# Patient Record
Sex: Male | Born: 1935 | Race: Black or African American | Hispanic: No | Smoking: Never smoker
Health system: Southern US, Community
[De-identification: ages and names within clinical notes are randomized; demographics above are authoritative.]

## PROBLEM LIST (undated history)

## (undated) DIAGNOSIS — I10 Essential (primary) hypertension: Secondary | ICD-10-CM

## (undated) DIAGNOSIS — D649 Anemia, unspecified: Secondary | ICD-10-CM

## (undated) DIAGNOSIS — N289 Disorder of kidney and ureter, unspecified: Secondary | ICD-10-CM

## (undated) DIAGNOSIS — E162 Hypoglycemia, unspecified: Secondary | ICD-10-CM

## (undated) DIAGNOSIS — E119 Type 2 diabetes mellitus without complications: Secondary | ICD-10-CM

## (undated) DIAGNOSIS — N400 Enlarged prostate without lower urinary tract symptoms: Secondary | ICD-10-CM

## (undated) DIAGNOSIS — R339 Retention of urine, unspecified: Secondary | ICD-10-CM

## (undated) DIAGNOSIS — R609 Edema, unspecified: Secondary | ICD-10-CM

## (undated) HISTORY — PX: OTHER SURGICAL HISTORY: SHX169

---

## 2003-03-05 ENCOUNTER — Other Ambulatory Visit: Payer: Self-pay

## 2004-02-21 ENCOUNTER — Emergency Department: Payer: Self-pay | Admitting: Emergency Medicine

## 2004-02-24 ENCOUNTER — Ambulatory Visit: Payer: Self-pay | Admitting: Internal Medicine

## 2004-03-08 ENCOUNTER — Ambulatory Visit: Payer: Self-pay | Admitting: Urology

## 2004-03-08 ENCOUNTER — Other Ambulatory Visit: Payer: Self-pay

## 2004-03-10 ENCOUNTER — Ambulatory Visit: Payer: Self-pay | Admitting: Internal Medicine

## 2004-03-14 ENCOUNTER — Ambulatory Visit: Payer: Self-pay | Admitting: Urology

## 2005-03-13 ENCOUNTER — Inpatient Hospital Stay: Payer: Self-pay | Admitting: Internal Medicine

## 2005-04-10 ENCOUNTER — Other Ambulatory Visit: Payer: Self-pay

## 2005-04-10 ENCOUNTER — Ambulatory Visit: Payer: Self-pay | Admitting: Urology

## 2005-08-31 ENCOUNTER — Ambulatory Visit: Payer: Self-pay | Admitting: Internal Medicine

## 2005-09-05 ENCOUNTER — Ambulatory Visit: Payer: Self-pay | Admitting: Internal Medicine

## 2005-09-15 ENCOUNTER — Ambulatory Visit: Payer: Self-pay | Admitting: Surgery

## 2005-09-20 ENCOUNTER — Ambulatory Visit: Payer: Self-pay | Admitting: Surgery

## 2007-03-01 ENCOUNTER — Emergency Department: Payer: Self-pay | Admitting: Unknown Physician Specialty

## 2007-04-01 ENCOUNTER — Ambulatory Visit: Payer: Self-pay | Admitting: Family Medicine

## 2007-06-30 ENCOUNTER — Other Ambulatory Visit: Payer: Self-pay

## 2007-07-01 ENCOUNTER — Inpatient Hospital Stay: Payer: Self-pay | Admitting: Internal Medicine

## 2007-07-03 ENCOUNTER — Other Ambulatory Visit: Payer: Self-pay

## 2008-11-12 ENCOUNTER — Inpatient Hospital Stay: Payer: Self-pay | Admitting: Orthopedic Surgery

## 2009-03-03 ENCOUNTER — Emergency Department: Payer: Self-pay | Admitting: Emergency Medicine

## 2009-08-18 ENCOUNTER — Ambulatory Visit: Payer: Self-pay | Admitting: Gastroenterology

## 2009-12-08 ENCOUNTER — Inpatient Hospital Stay: Payer: Self-pay | Admitting: Internal Medicine

## 2010-05-25 ENCOUNTER — Inpatient Hospital Stay: Payer: Self-pay | Admitting: Surgery

## 2010-10-23 ENCOUNTER — Emergency Department: Payer: Self-pay | Admitting: Unknown Physician Specialty

## 2012-08-13 ENCOUNTER — Ambulatory Visit: Payer: Self-pay | Admitting: Urology

## 2012-08-19 ENCOUNTER — Ambulatory Visit: Payer: Self-pay | Admitting: Urology

## 2014-04-24 NOTE — Op Note (Signed)
PATIENT NAME:  Matthew Goodwin, Steve MR#:  [id number] DATE OF BIRTH:  Apr 28, 1920  DATE OF PROCEDURE:  08/19/2012  PREOPERATIVE DIAGNOSES:  1.  Urethral stricture disease.  2.  Urinary incontinence.   POSTOPERATIVE DIAGNOSES: 1.  Urethral stricture disease.  2.  Urinary incontinence.   PROCEDURE: Visual internal urethrotomy using the holmium laser.   SURGEON: Suszanne ConnersMichael R. Evelene CroonWolff, M.D.   ANESTHETIST: Dr. Noralyn Pickarroll.   ANESTHESIA METHOD: General and local per surgeon.   INDICATIONS: See the dictated history and physical. After informed consent, the patient requests the above procedure.   OPERATIVE SUMMARY: After adequate general anesthesia had been obtained, perineum was prepped and draped in the usual fashion. The 21-French cystoscope was placed into the urethra and coupled to the camera. The patient had extensive urethral stricture disease extending from approximately 2 cm proximal to the meatus all the way to the bladder neck. In fact, the cystoscope could not be advanced beyond 2 cm into the urethra. A 0.035 guidewire was then placed. The guidewire was curled into the bladder. The visual urethrotome was then used to incise the strictures at the 12 o'clock position and eventually the bladder was entered. The scope was then backed out and stricture was further incised with the holmium laser fiber. At the conclusion of internal urethrotomy, the urethra was approximately 20-French in caliber. The bladder was heavily trabeculated. Both ureteral orifices were identified and had clear efflux. No bladder tumors were identified. The patient also had a posterior urethral diverticulum located at the bulbar urethra. At this point, the scope was removed taking care to leave the guidewire in position. An 18-French council tipped catheter was advanced over the guidewire and positioned in the bladder. The guidewire was removed. The catheter was irrigated until clear. 10 mL of viscous Xylocaine was injected into the urethra  around the catheter. A B and O suppository was placed. The procedure was then terminated and the patient was transferred to the recovery room in stable condition.   ____________________________ Suszanne ConnersMichael R. Evelene CroonWolff, MD mrw:aw D: 08/19/2012 11:39:24 ET T: 08/19/2012 11:57:00 ET JOB#: [id number]  cc: Suszanne ConnersMichael R. Evelene CroonWolff, MD, <Dictator> Orson ApeMICHAEL R WOLFF MD ELECTRONICALLY SIGNED 08/20/2012 8:26
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
## 2015-05-25 ENCOUNTER — Other Ambulatory Visit: Payer: Self-pay | Admitting: Internal Medicine

## 2015-05-25 DIAGNOSIS — R601 Generalized edema: Secondary | ICD-10-CM

## 2015-05-27 ENCOUNTER — Ambulatory Visit
Admission: RE | Admit: 2015-05-27 | Discharge: 2015-05-27 | Disposition: A | Discharge status: Home / Self Care | Payer: Medicare Other | Source: Ambulatory Visit | Attending: Internal Medicine | Admitting: Internal Medicine

## 2015-05-27 DIAGNOSIS — I340 Nonrheumatic mitral (valve) insufficiency: Secondary | ICD-10-CM | POA: Insufficient documentation

## 2015-05-27 DIAGNOSIS — R601 Generalized edema: Secondary | ICD-10-CM | POA: Diagnosis not present

## 2015-05-27 DIAGNOSIS — I350 Nonrheumatic aortic (valve) stenosis: Secondary | ICD-10-CM | POA: Insufficient documentation

## 2015-05-27 NOTE — Progress Notes (Signed)
*  PRELIMINARY RESULTS* Echocardiogram 2D Echocardiogram has been performed.  Matthew Goodwin 05/27/2015, 11:20 AM

## 2015-07-02 ENCOUNTER — Other Ambulatory Visit: Payer: Self-pay | Admitting: Internal Medicine

## 2015-07-02 DIAGNOSIS — R7989 Other specified abnormal findings of blood chemistry: Secondary | ICD-10-CM

## 2015-07-02 DIAGNOSIS — N289 Disorder of kidney and ureter, unspecified: Secondary | ICD-10-CM

## 2015-07-21 ENCOUNTER — Ambulatory Visit
Admission: RE | Admit: 2015-07-21 | Discharge: 2015-07-21 | Disposition: A | Discharge status: Home / Self Care | Payer: Medicare Other | Source: Ambulatory Visit | Attending: Internal Medicine | Admitting: Internal Medicine

## 2015-07-21 DIAGNOSIS — R748 Abnormal levels of other serum enzymes: Secondary | ICD-10-CM | POA: Diagnosis not present

## 2015-07-21 DIAGNOSIS — N289 Disorder of kidney and ureter, unspecified: Secondary | ICD-10-CM | POA: Diagnosis not present

## 2015-07-21 DIAGNOSIS — R7989 Other specified abnormal findings of blood chemistry: Secondary | ICD-10-CM

## 2015-07-21 DIAGNOSIS — N261 Atrophy of kidney (terminal): Secondary | ICD-10-CM | POA: Insufficient documentation

## 2016-12-22 ENCOUNTER — Emergency Department
Admission: EM | Admit: 2016-12-22 | Discharge: 2016-12-22 | Disposition: A | Discharge status: Home / Self Care | Payer: Medicare Other | Attending: Emergency Medicine | Admitting: Emergency Medicine

## 2016-12-22 ENCOUNTER — Emergency Department: Payer: Medicare Other

## 2016-12-22 ENCOUNTER — Encounter: Payer: Self-pay | Admitting: Emergency Medicine

## 2016-12-22 DIAGNOSIS — Y929 Unspecified place or not applicable: Secondary | ICD-10-CM | POA: Insufficient documentation

## 2016-12-22 DIAGNOSIS — Y939 Activity, unspecified: Secondary | ICD-10-CM | POA: Insufficient documentation

## 2016-12-22 DIAGNOSIS — W010XXA Fall on same level from slipping, tripping and stumbling without subsequent striking against object, initial encounter: Secondary | ICD-10-CM | POA: Diagnosis not present

## 2016-12-22 DIAGNOSIS — W19XXXA Unspecified fall, initial encounter: Secondary | ICD-10-CM

## 2016-12-22 DIAGNOSIS — T148XXA Other injury of unspecified body region, initial encounter: Secondary | ICD-10-CM

## 2016-12-22 DIAGNOSIS — Y999 Unspecified external cause status: Secondary | ICD-10-CM | POA: Diagnosis not present

## 2016-12-22 DIAGNOSIS — I10 Essential (primary) hypertension: Secondary | ICD-10-CM | POA: Insufficient documentation

## 2016-12-22 DIAGNOSIS — E119 Type 2 diabetes mellitus without complications: Secondary | ICD-10-CM | POA: Diagnosis not present

## 2016-12-22 DIAGNOSIS — S0081XA Abrasion of other part of head, initial encounter: Secondary | ICD-10-CM | POA: Diagnosis not present

## 2016-12-22 DIAGNOSIS — S0990XA Unspecified injury of head, initial encounter: Secondary | ICD-10-CM | POA: Diagnosis present
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
## 2016-12-22 NOTE — ED Notes (Signed)
Attempted to call contact number in chart. Spoke with Matthew NorrisFrances Watlington, who is the emergency contact in his chart. Ms. Jac CanavanWatlington states that pt lives in a group home but she is unsure of the name of the group home and she is unable to come pick patient up.   Pt does not know the name of the group home that he is from.

## 2016-12-22 NOTE — ED Notes (Signed)
Phone number and address in the chart is for group home that pt no longer lives at. [employer] attempted to pull address for patient. Current address is not correct in their system either.  Per patient cousin, pt lives at a group home on [address] but she is unsure of the exact address or the phone number for the patient.

## 2016-12-22 NOTE — ED Notes (Signed)
Pt's cousin came to hospital to see patient. Pt cousin provided number for [facility], which is where she states that pt lives now. This RN spoke with family care home and informed them that pt was here in our ED and that he was ready to be discharged. Pt cousin, Shirlee LimerickMarion, is agreeable to taking pt home.

## 2018-02-13 ENCOUNTER — Emergency Department
Admission: EM | Admit: 2018-02-13 | Discharge: 2018-02-13 | Disposition: A | Discharge status: Home / Self Care | Payer: Medicare Other | Attending: Emergency Medicine | Admitting: Emergency Medicine

## 2018-02-13 ENCOUNTER — Other Ambulatory Visit: Payer: Self-pay

## 2018-02-13 ENCOUNTER — Emergency Department: Payer: Medicare Other

## 2018-02-13 DIAGNOSIS — Z79899 Other long term (current) drug therapy: Secondary | ICD-10-CM | POA: Insufficient documentation

## 2018-02-13 DIAGNOSIS — N3000 Acute cystitis without hematuria: Secondary | ICD-10-CM | POA: Diagnosis not present

## 2018-02-13 DIAGNOSIS — R531 Weakness: Secondary | ICD-10-CM

## 2018-02-13 HISTORY — DX: Hypoglycemia, unspecified: E16.2

## 2018-02-13 HISTORY — DX: Benign prostatic hyperplasia without lower urinary tract symptoms: N40.0

## 2018-02-13 HISTORY — DX: Essential (primary) hypertension: I10

## 2018-02-13 HISTORY — DX: Anemia, unspecified: D64.9

## 2018-02-13 HISTORY — DX: Retention of urine, unspecified: R33.9

## 2018-02-13 HISTORY — DX: Type 2 diabetes mellitus without complications: E11.9

## 2018-02-13 HISTORY — DX: Edema, unspecified: R60.9

## 2018-02-13 LAB — URINALYSIS, COMPLETE (UACMP) WITH MICROSCOPIC
BILIRUBIN URINE: NEGATIVE
Glucose, UA: NEGATIVE mg/dL
Hgb urine dipstick: NEGATIVE
Ketones, ur: NEGATIVE mg/dL
Nitrite: POSITIVE — AB
PH: 6 (ref 5.0–8.0)
Protein, ur: NEGATIVE mg/dL
Specific Gravity, Urine: 1.014 (ref 1.005–1.030)

## 2018-02-13 LAB — CBC
HEMATOCRIT: 40.1 % (ref 39.0–52.0)
HEMOGLOBIN: 12.9 g/dL — AB (ref 13.0–17.0)
MCH: 30.2 pg (ref 26.0–34.0)
MCHC: 32.2 g/dL (ref 30.0–36.0)
MCV: 93.9 fL (ref 80.0–100.0)
Platelets: 158 10*3/uL (ref 150–400)
RBC: 4.27 MIL/uL (ref 4.22–5.81)
RDW: 12.8 % (ref 11.5–15.5)
WBC: 3.4 10*3/uL — ABNORMAL LOW (ref 4.0–10.5)
nRBC: 0 % (ref 0.0–0.2)

## 2018-02-13 LAB — COMPREHENSIVE METABOLIC PANEL
ALK PHOS: 52 U/L (ref 38–126)
ALT: 18 U/L (ref 0–44)
ANION GAP: 6 (ref 5–15)
AST: 31 U/L (ref 15–41)
Albumin: 3.8 g/dL (ref 3.5–5.0)
BILIRUBIN TOTAL: 1.1 mg/dL (ref 0.3–1.2)
BUN: 15 mg/dL (ref 8–23)
CALCIUM: 8.9 mg/dL (ref 8.9–10.3)
CO2: 25 mmol/L (ref 22–32)
Chloride: 106 mmol/L (ref 98–111)
Creatinine, Ser: 1.32 mg/dL — ABNORMAL HIGH (ref 0.61–1.24)
GFR calc Af Amer: 53 mL/min — ABNORMAL LOW (ref >60)
GFR, EST NON AFRICAN AMERICAN: 46 mL/min — AB (ref >60)
Glucose, Bld: 131 mg/dL — ABNORMAL HIGH (ref 70–99)
POTASSIUM: 4.3 mmol/L (ref 3.5–5.1)
Sodium: 137 mmol/L (ref 135–145)
TOTAL PROTEIN: 7 g/dL (ref 6.5–8.1)

## 2018-02-13 LAB — TROPONIN I
TROPONIN I: 0.05 ng/mL — AB (ref <0.03)
TROPONIN I: 0.05 ng/mL — AB (ref <0.03)

## 2018-02-13 MED ORDER — SULFAMETHOXAZOLE-TRIMETHOPRIM 800-160 MG PO TABS: 1.0000 | ORAL_TABLET | Freq: Two times a day (BID) | ORAL | 2 refills | Status: DC

## 2018-02-13 MED ORDER — SODIUM CHLORIDE 0.9 % IV SOLN
1.0000 g | Freq: Once | INTRAVENOUS | Status: AC
  Administered 2018-02-13: 1 g via INTRAVENOUS
  Filled 2018-02-13: qty 10

## 2018-02-13 MED ORDER — SULFAMETHOXAZOLE-TRIMETHOPRIM 800-160 MG PO TABS: 1.0000 | ORAL_TABLET | Freq: Two times a day (BID) | ORAL | 0 refills | Status: AC

## 2018-02-13 NOTE — ED Provider Notes (Signed)
Patient was signed out to me by Dr. Fritz Pickerel house key.  85 year old gentleman who lives in a nursing facility who presented for generalized weakness without any focal symptoms.  He did have a troponin of 0.05, which has been repeated and unchanged.  I did confirm with the patient that he has not been having any chest pain.  During his ED stay, the patient has remained hemodynamically stable.  He states that he is feeling better now than upon arrival.  His UA is consistent with a UTI.  A urine culture has been sent and the patient will receive IV Rocephin here.  I have asked the patient if he prefers to return home, or stay at the hospital, and he states that he is feeling better and would like to go home with oral antibiotics.  I have written instructions for close PMD follow-up.  We discussed return precautions.  At this time, the patient is safe for discharge home.   Rockne Menghini, MD 02/13/18 Windell Moment

## 2018-02-13 NOTE — Discharge Instructions (Addendum)
Please drink plenty of fluid to stay well-hydrated.  Take the entire course of antibiotics, even if you are feeling better.  Return to the emergency department if you develop severe pain, lightheadedness or fainting, chest pain, shortness of breath, fever, or any other symptoms concerning to you.

## 2018-02-13 NOTE — ED Triage Notes (Signed)
Patient from [facility] with c/o of weakness to b/l lower extremities. Patient reports s/p fall yesterday c/o of headache since fall. Reports hit the back of his hes when he fell.

## 2018-02-13 NOTE — ED Provider Notes (Signed)
[facility] Emergency Department Provider Note  Time seen: 2:17 PM  I have reviewed the triage vital signs and the nursing notes.   HISTORY  Chief Complaint Weakness    HPI Matthew Goodwin is a [age] y.o. male with a past medical history of hypertension, BPH, presents the emergency department for generalized weakness.  According to EMS report patient was very weak today unable to stand without significant assistance so they sent him to the emergency department from his nursing facility for evaluation.  Currently the patient is awake and alert, he has no complaints besides a mild headache.  He states he fell yesterday hitting his head.  Patient denies any other symptoms.    No past medical history on file.  There are no active problems to display for this patient.    Prior to Admission medications   Medication Sig Start Date End Date Taking? Authorizing Provider  hydrochlorothiazide (HYDRODIURIL) 25 MG tablet Take 1 tablet by mouth daily. 12/11/16   [provider]  levocetirizine (XYZAL) 5 MG tablet Take 1 tablet by mouth daily. 12/11/16   [provider]  losartan (COZAAR) 50 MG tablet Take 1 tablet by mouth daily. 12/11/16   [provider]  metoprolol succinate (TOPROL-XL) 50 MG 24 hr tablet Take 1 tablet by mouth daily. 12/11/16   [provider]  tamsulosin (FLOMAX) 0.4 MG CAPS capsule Take 1 capsule by mouth daily. 12/11/16   [provider]    Not on File  No family history on file.  Social History Social History   Tobacco Use  . Smoking status: Not on file  Substance Use Topics  . Alcohol use: Not on file  . Drug use: Not on file    Review of Systems Constitutional: Negative for fever.  Generalized weakness Cardiovascular: Negative for chest pain. Respiratory: Negative for shortness of breath. Gastrointestinal: Negative for abdominal pain, vomiting Musculoskeletal: Negative for musculoskeletal  complaints Neurological: Negative for headache All other ROS negative  ____________________________________________   PHYSICAL EXAM:  Constitutional: Patient is awake and alert, no distress, overall very well-appearing. Eyes: Normal exam ENT   Head: Normocephalic and atraumatic, without abrasion or hematomas.   Mouth/Throat: Mucous membranes are moist. Cardiovascular: Normal rate, regular rhythm. Respiratory: Normal respiratory effort without tachypnea nor retractions. Breath sounds are clear  Gastrointestinal: Soft and nontender. No distention. Musculoskeletal: Nontender with normal range of motion in all extremities.  Neurologic:  Normal speech and language.  Patient has equal grip strength bilaterally 5/5 motor bilateral upper extremities.  Patient has 4+/5 motor in bilateral lower extremities.  No focal deficits identified on exam. Skin:  Skin is warm, dry and intact.  Psychiatric: Mood and affect are normal. Speech and behavior are normal.   ____________________________________________    EKG  EKG viewed and interpreted by myself shows appears to be a paced rhythm at 91 bpm, narrow QRS, normal axis, prolonged QTC otherwise normal intervals, nonspecific ST changes.  ____________________________________________    RADIOLOGY  CT scan of the head is negative, no acute abnormality.  ____________________________________________   INITIAL IMPRESSION / ASSESSMENT AND PLAN / ED COURSE  Pertinent labs & imaging results that were available during my care of the patient were reviewed by me and considered in my medical decision making (see chart for details).  Patient presents to the emergency department for generalized weakness for the past 2 days possible fall yesterday.  Overall the patient appears very well, normal exam.  Patient does have mild weakness of bilateral  lower extremities, appears to be equal bilaterally without focal deficit identified.  Given the potential  for possible fall yesterday we will obtain CT imaging of the head.  We will check labs including cardiac enzymes and a urinalysis.  Overall patient appears well and in no distress.  CT scan of the head is negative, no acute abnormality.  Troponin 0.05 however no old troponins for comparison.  No chest pain or shortness of breath.  We will repeat a troponin to ensure this is stable for the patient.  Although the patient is more weak than normal and the weakness is bilateral do not suspect CVA.  CT head negative.  Patient lives at a skilled nursing facility I do believe the patient would be safe for discharge home if repeat troponin and urinalysis are normal/unchanged.  Patient care signed out to Dr. Sharma CovertNorman  ____________________________________________   FINAL CLINICAL IMPRESSION(S) / ED DIAGNOSES  Weakness   Matthew Goodwin, Hooper Petteway, MD 02/13/18 1524

## 2018-02-13 NOTE — ED Notes (Signed)
Repeat trop drawn and sent

## 2018-02-13 NOTE — ED Notes (Signed)
Awaiting ct head. Labs sent.

## 2018-02-16 LAB — URINE CULTURE

## 2019-05-06 ENCOUNTER — Emergency Department: Payer: Medicare Other

## 2019-05-06 ENCOUNTER — Other Ambulatory Visit: Payer: Self-pay

## 2019-05-06 ENCOUNTER — Inpatient Hospital Stay
Admission: EM | Admit: 2019-05-06 | Discharge: 2019-05-08 | DRG: 543 | Disposition: A | Discharge status: Home Health | Payer: Medicare Other | Attending: Internal Medicine | Admitting: Internal Medicine

## 2019-05-06 DIAGNOSIS — N1330 Unspecified hydronephrosis: Secondary | ICD-10-CM

## 2019-05-06 DIAGNOSIS — S32020A Wedge compression fracture of second lumbar vertebra, initial encounter for closed fracture: Secondary | ICD-10-CM | POA: Diagnosis present

## 2019-05-06 DIAGNOSIS — Z23 Encounter for immunization: Secondary | ICD-10-CM

## 2019-05-06 DIAGNOSIS — S32010A Wedge compression fracture of first lumbar vertebra, initial encounter for closed fracture: Secondary | ICD-10-CM

## 2019-05-06 DIAGNOSIS — E119 Type 2 diabetes mellitus without complications: Secondary | ICD-10-CM

## 2019-05-06 DIAGNOSIS — D696 Thrombocytopenia, unspecified: Secondary | ICD-10-CM | POA: Diagnosis present

## 2019-05-06 DIAGNOSIS — N179 Acute kidney failure, unspecified: Secondary | ICD-10-CM | POA: Diagnosis present

## 2019-05-06 DIAGNOSIS — F0390 Unspecified dementia without behavioral disturbance: Secondary | ICD-10-CM | POA: Diagnosis present

## 2019-05-06 DIAGNOSIS — I129 Hypertensive chronic kidney disease with stage 1 through stage 4 chronic kidney disease, or unspecified chronic kidney disease: Secondary | ICD-10-CM | POA: Diagnosis present

## 2019-05-06 DIAGNOSIS — Y92009 Unspecified place in unspecified non-institutional (private) residence as the place of occurrence of the external cause: Secondary | ICD-10-CM

## 2019-05-06 DIAGNOSIS — W19XXXA Unspecified fall, initial encounter: Secondary | ICD-10-CM | POA: Diagnosis present

## 2019-05-06 DIAGNOSIS — N390 Urinary tract infection, site not specified: Secondary | ICD-10-CM

## 2019-05-06 DIAGNOSIS — S32019A Unspecified fracture of first lumbar vertebra, initial encounter for closed fracture: Secondary | ICD-10-CM

## 2019-05-06 DIAGNOSIS — I10 Essential (primary) hypertension: Secondary | ICD-10-CM

## 2019-05-06 DIAGNOSIS — M545 Low back pain, unspecified: Secondary | ICD-10-CM

## 2019-05-06 DIAGNOSIS — M549 Dorsalgia, unspecified: Secondary | ICD-10-CM | POA: Diagnosis not present

## 2019-05-06 DIAGNOSIS — I700 Atherosclerosis of aorta: Secondary | ICD-10-CM | POA: Diagnosis present

## 2019-05-06 DIAGNOSIS — N289 Disorder of kidney and ureter, unspecified: Secondary | ICD-10-CM | POA: Diagnosis present

## 2019-05-06 DIAGNOSIS — M419 Scoliosis, unspecified: Secondary | ICD-10-CM | POA: Diagnosis present

## 2019-05-06 DIAGNOSIS — N3000 Acute cystitis without hematuria: Secondary | ICD-10-CM

## 2019-05-06 DIAGNOSIS — N1831 Chronic kidney disease, stage 3a: Secondary | ICD-10-CM | POA: Diagnosis present

## 2019-05-06 DIAGNOSIS — Z79899 Other long term (current) drug therapy: Secondary | ICD-10-CM

## 2019-05-06 DIAGNOSIS — Z20822 Contact with and (suspected) exposure to covid-19: Secondary | ICD-10-CM | POA: Diagnosis present

## 2019-05-06 DIAGNOSIS — E1121 Type 2 diabetes mellitus with diabetic nephropathy: Secondary | ICD-10-CM

## 2019-05-06 DIAGNOSIS — M48061 Spinal stenosis, lumbar region without neurogenic claudication: Secondary | ICD-10-CM | POA: Diagnosis present

## 2019-05-06 DIAGNOSIS — M4856XA Collapsed vertebra, not elsewhere classified, lumbar region, initial encounter for fracture: Secondary | ICD-10-CM | POA: Diagnosis not present

## 2019-05-06 DIAGNOSIS — N400 Enlarged prostate without lower urinary tract symptoms: Secondary | ICD-10-CM | POA: Diagnosis present

## 2019-05-06 DIAGNOSIS — H9190 Unspecified hearing loss, unspecified ear: Secondary | ICD-10-CM | POA: Diagnosis present

## 2019-05-06 DIAGNOSIS — N136 Pyonephrosis: Secondary | ICD-10-CM | POA: Diagnosis present

## 2019-05-06 DIAGNOSIS — E1122 Type 2 diabetes mellitus with diabetic chronic kidney disease: Secondary | ICD-10-CM | POA: Diagnosis present

## 2019-05-06 DIAGNOSIS — I350 Nonrheumatic aortic (valve) stenosis: Secondary | ICD-10-CM

## 2019-05-06 HISTORY — DX: Disorder of kidney and ureter, unspecified: N28.9

## 2019-05-06 HISTORY — DX: Unspecified fall, initial encounter: Y92.009

## 2019-05-06 HISTORY — DX: Unspecified hydronephrosis: N13.30

## 2019-05-06 LAB — CBC WITH DIFFERENTIAL/PLATELET
Abs Immature Granulocytes: 0.02 10*3/uL (ref 0.00–0.07)
Basophils Absolute: 0 10*3/uL (ref 0.0–0.1)
Basophils Relative: 0 %
Eosinophils Absolute: 0 10*3/uL (ref 0.0–0.5)
Eosinophils Relative: 1 %
HCT: 39.6 % (ref 39.0–52.0)
Hemoglobin: 13.2 g/dL (ref 13.0–17.0)
Immature Granulocytes: 0 %
Lymphocytes Relative: 20 %
Lymphs Abs: 1.3 10*3/uL (ref 0.7–4.0)
MCH: 30.5 pg (ref 26.0–34.0)
MCHC: 33.3 g/dL (ref 30.0–36.0)
MCV: 91.5 fL (ref 80.0–100.0)
Monocytes Absolute: 0.5 10*3/uL (ref 0.1–1.0)
Monocytes Relative: 7 %
Neutro Abs: 4.5 10*3/uL (ref 1.7–7.7)
Neutrophils Relative %: 72 %
Platelets: 138 10*3/uL — ABNORMAL LOW (ref 150–400)
RBC: 4.33 MIL/uL (ref 4.22–5.81)
RDW: 12.6 % (ref 11.5–15.5)
WBC: 6.3 10*3/uL (ref 4.0–10.5)
nRBC: 0 % (ref 0.0–0.2)

## 2019-05-06 LAB — URINALYSIS, COMPLETE (UACMP) WITH MICROSCOPIC
Bilirubin Urine: NEGATIVE
Glucose, UA: NEGATIVE mg/dL
Hgb urine dipstick: NEGATIVE
Ketones, ur: NEGATIVE mg/dL
Nitrite: POSITIVE — AB
Protein, ur: NEGATIVE mg/dL
Specific Gravity, Urine: 1.01 (ref 1.005–1.030)
pH: 8 (ref 5.0–8.0)

## 2019-05-06 LAB — RESPIRATORY PANEL BY RT PCR (FLU A&B, COVID)
Influenza A by PCR: NEGATIVE
Influenza B by PCR: NEGATIVE
SARS Coronavirus 2 by RT PCR: NEGATIVE

## 2019-05-06 LAB — GLUCOSE, CAPILLARY
Glucose-Capillary: 80 mg/dL (ref 70–99)
Glucose-Capillary: 111 mg/dL — ABNORMAL HIGH (ref 70–99)

## 2019-05-06 LAB — BASIC METABOLIC PANEL
Anion gap: 6 (ref 5–15)
BUN: 18 mg/dL (ref 8–23)
CO2: 25 mmol/L (ref 22–32)
Calcium: 9.2 mg/dL (ref 8.9–10.3)
Chloride: 107 mmol/L (ref 98–111)
Creatinine, Ser: 1.29 mg/dL — ABNORMAL HIGH (ref 0.61–1.24)
GFR calc Af Amer: 54 mL/min — ABNORMAL LOW (ref >60)
GFR calc non Af Amer: 46 mL/min — ABNORMAL LOW (ref >60)
Glucose, Bld: 120 mg/dL — ABNORMAL HIGH (ref 70–99)
Potassium: 5.1 mmol/L (ref 3.5–5.1)
Sodium: 138 mmol/L (ref 135–145)

## 2019-05-06 LAB — HEMOGLOBIN A1C
Hgb A1c MFr Bld: 6 % — ABNORMAL HIGH (ref 4.8–5.6)
Mean Plasma Glucose: 125.5 mg/dL

## 2019-05-06 MED ORDER — SODIUM CHLORIDE 0.9% FLUSH
3.0000 mL | Freq: Two times a day (BID) | INTRAVENOUS | Status: DC
  Administered 2019-05-06 – 2019-05-07 (×2): 3 mL via INTRAVENOUS

## 2019-05-06 MED ORDER — LOSARTAN POTASSIUM 50 MG PO TABS
50.0000 mg | ORAL_TABLET | Freq: Every day | ORAL | Status: DC
  Administered 2019-05-07 – 2019-05-08 (×2): 50 mg via ORAL
  Filled 2019-05-06 (×2): qty 1

## 2019-05-06 MED ORDER — ONDANSETRON HCL 4 MG PO TABS: 4.0000 mg | ORAL_TABLET | Freq: Four times a day (QID) | ORAL | Status: DC | PRN

## 2019-05-06 MED ORDER — OXYCODONE HCL 5 MG PO TABS
5.0000 mg | ORAL_TABLET | ORAL | Status: DC | PRN
  Administered 2019-05-07 – 2019-05-08 (×3): 5 mg via ORAL
  Filled 2019-05-06 (×3): qty 1

## 2019-05-06 MED ORDER — ONDANSETRON HCL 4 MG/2ML IJ SOLN: 4.0000 mg | Freq: Four times a day (QID) | INTRAMUSCULAR | Status: DC | PRN

## 2019-05-06 MED ORDER — LIDOCAINE 5 % EX PTCH
1.0000 | MEDICATED_PATCH | CUTANEOUS | Status: DC
  Administered 2019-05-06 – 2019-05-08 (×3): 1 via TRANSDERMAL
  Filled 2019-05-06 (×3): qty 1

## 2019-05-06 MED ORDER — IOHEXOL 300 MG/ML  SOLN
100.0000 mL | Freq: Once | INTRAMUSCULAR | Status: AC | PRN
  Administered 2019-05-06: 12:00:00 100 mL via INTRAVENOUS

## 2019-05-06 MED ORDER — INSULIN ASPART 100 UNIT/ML ~~LOC~~ SOLN: 0.0000 [IU] | Freq: Three times a day (TID) | SUBCUTANEOUS | Status: DC

## 2019-05-06 MED ORDER — SODIUM CHLORIDE 0.9 % IV SOLN: 250.0000 mL | INTRAVENOUS | Status: DC | PRN

## 2019-05-06 MED ORDER — FLUOROMETHOLONE 0.1 % OP SUSP
1.0000 [drp] | Freq: Every day | OPHTHALMIC | Status: DC
  Administered 2019-05-07 – 2019-05-08 (×2): 1 [drp] via OPHTHALMIC
  Filled 2019-05-06: qty 5

## 2019-05-06 MED ORDER — ACETAMINOPHEN 500 MG PO TABS
1000.0000 mg | ORAL_TABLET | Freq: Once | ORAL | Status: AC
  Administered 2019-05-06: 10:00:00 1000 mg via ORAL
  Filled 2019-05-06: qty 2

## 2019-05-06 MED ORDER — SODIUM CHLORIDE 0.9 % IV SOLN
1.0000 g | Freq: Once | INTRAVENOUS | Status: AC
  Administered 2019-05-06: 15:00:00 1 g via INTRAVENOUS
  Filled 2019-05-06: qty 10

## 2019-05-06 MED ORDER — ATORVASTATIN CALCIUM 20 MG PO TABS
10.0000 mg | ORAL_TABLET | Freq: Every day | ORAL | Status: DC
  Administered 2019-05-06 – 2019-05-07 (×2): 10 mg via ORAL
  Filled 2019-05-06 (×2): qty 1

## 2019-05-06 MED ORDER — ACETAMINOPHEN 650 MG RE SUPP: 650.0000 mg | Freq: Four times a day (QID) | RECTAL | Status: DC | PRN

## 2019-05-06 MED ORDER — PNEUMOCOCCAL VAC POLYVALENT 25 MCG/0.5ML IJ INJ
0.5000 mL | INJECTION | INTRAMUSCULAR | Status: AC
  Administered 2019-05-07: 08:00:00 0.5 mL via INTRAMUSCULAR

## 2019-05-06 MED ORDER — TAMSULOSIN HCL 0.4 MG PO CAPS
0.4000 mg | ORAL_CAPSULE | Freq: Every day | ORAL | Status: DC
  Administered 2019-05-07 – 2019-05-08 (×2): 0.4 mg via ORAL
  Filled 2019-05-06 (×2): qty 1

## 2019-05-06 MED ORDER — CEPHALEXIN 500 MG PO CAPS
500.0000 mg | ORAL_CAPSULE | Freq: Two times a day (BID) | ORAL | Status: DC
  Administered 2019-05-06 – 2019-05-08 (×4): 500 mg via ORAL
  Filled 2019-05-06 (×4): qty 1

## 2019-05-06 MED ORDER — SODIUM CHLORIDE 0.9% FLUSH
3.0000 mL | Freq: Two times a day (BID) | INTRAVENOUS | Status: DC
  Administered 2019-05-06: 21:00:00 3 mL via INTRAVENOUS

## 2019-05-06 MED ORDER — HYDRALAZINE HCL 10 MG PO TABS
10.0000 mg | ORAL_TABLET | Freq: Three times a day (TID) | ORAL | Status: DC | PRN
  Administered 2019-05-06: 19:00:00 10 mg via ORAL
  Filled 2019-05-06 (×3): qty 1

## 2019-05-06 MED ORDER — SODIUM CHLORIDE 0.9% FLUSH: 3.0000 mL | INTRAVENOUS | Status: DC | PRN

## 2019-05-06 MED ORDER — LEVOCETIRIZINE DIHYDROCHLORIDE 5 MG PO TABS: 5.0000 mg | ORAL_TABLET | Freq: Every day | ORAL | Status: DC

## 2019-05-06 MED ORDER — MORPHINE SULFATE (PF) 2 MG/ML IV SOLN
2.0000 mg | INTRAVENOUS | Status: DC | PRN
  Administered 2019-05-07: 2 mg via INTRAVENOUS
  Filled 2019-05-06: qty 1

## 2019-05-06 MED ORDER — ACETAMINOPHEN 325 MG PO TABS: 650.0000 mg | ORAL_TABLET | Freq: Four times a day (QID) | ORAL | Status: DC | PRN

## 2019-05-06 MED ORDER — METOPROLOL SUCCINATE ER 50 MG PO TB24
100.0000 mg | ORAL_TABLET | Freq: Every day | ORAL | Status: DC
  Administered 2019-05-07 – 2019-05-08 (×2): 100 mg via ORAL
  Filled 2019-05-06 (×2): qty 2

## 2019-05-06 MED ORDER — LORATADINE 10 MG PO TABS
10.0000 mg | ORAL_TABLET | Freq: Every day | ORAL | Status: DC
  Administered 2019-05-07 – 2019-05-08 (×2): 10 mg via ORAL
  Filled 2019-05-06 (×2): qty 1

## 2019-05-06 NOTE — H&P (Signed)
History and Physical  Matthew Goodwin HQI:[id number] DOB: Mar 26, 1920 DOA: 05/06/2019  PCP: Sherrie Mustache, MD   Chief Complaint: back pain  HPI:  89yom PMH presented from [facility] after a fall resulting in back pain.  He was unable to sit up or ambulate in the emergency department was referred for observation.  Patient reports falling but is unable to provide any details.  He appears to possibly have some mild dementia, does comment on review of systems as documented below, but despite careful questioning, is unable to offer any further details in regard to recent events.  He denied back pain until he tried to sit up.  Answers a lot of questions "I do not know about that".  Discussed with Leanne Chang at [facility], she reports patient has been doing well, he does require some assistance with bathing but otherwise is pretty independent and frequently walking.  Fasting blood sugars are less than 100.  Yesterday he tried to sit down, missed the chair and landed with his buttocks on the concrete.  Did not strike head.  He was able to get up and walk thereafter but as the last 24 hours have progressed he has had increased pain and was therefore sent to the emergency department.  Discussed medical, surgical history which is documented below.  There is no power of attorney.  No further history is available.  Chart review: . No office visits or hospitalizations on file  ED Course: Treated with Tylenol, lidocaine patch, ceftriaxone  Review of Systems:  Patient denies fever, there is vision problems, sore throat, chest pain, shortness of breath, abdominal pain.  History is unreliable and cannot get a clear answer to some questions.  PMH . Diabetes mellitus type 2 . BPH . Essential hypertension . Renal insufficiency . Remainder reviewed in Epic  PSH . Unknown  Family history includes: . Patient does not know  Social History . Non-smoker, nondrinker  Allergies . None noted  Meds  include: No current facility-administered medications on file prior to encounter.   Current Outpatient Medications on File Prior to Encounter  Medication Sig Dispense Refill  . acetaminophen (TYLENOL) 325 MG tablet Take 650 mg by mouth every 6 (six) hours as needed for mild pain or moderate pain.    Marland Kitchen atorvastatin (LIPITOR) 10 MG tablet Take 10 mg by mouth at bedtime.    . docusate sodium (COLACE) 100 MG capsule Take 100 mg by mouth daily.    . ferrous sulfate 325 (65 FE) MG tablet Take 325 mg by mouth daily.    . fluorometholone (FML) 0.1 % ophthalmic suspension Place 1 drop into both eyes daily.    Marland Kitchen levocetirizine (XYZAL) 5 MG tablet Take 5 mg by mouth daily.     Marland Kitchen losartan (COZAAR) 50 MG tablet Take 50 mg by mouth daily.     . metoprolol succinate (TOPROL-XL) 100 MG 24 hr tablet Take 100 mg by mouth daily.     . Omega-3 Fatty Acids (FISH OIL) 1000 MG CAPS Take 1,000 mg by mouth 2 (two) times daily.    . tamsulosin (FLOMAX) 0.4 MG CAPS capsule Take 0.4 mg by mouth daily.     . Vitamin D, Ergocalciferol, (DRISDOL) 1.25 MG (50000 UNIT) CAPS capsule Take 50,000 Units by mouth every Wednesday.     Physicial Exam   Vitals:  . Afebrile, 98.4, 62, 142/72, 100% on room air.  Constitutional:   . Appears calm and comfortable Eyes:  . pupils and irises appear normal .  Normal lids  ENMT:  . Hard of hearing . Lips appear normal.   Neck:  . neck appears normal, no masses . no thyromegaly Respiratory:  . CTA bilaterally, no w/r/r.  . Respiratory effort normal. Cardiovascular:  . RRR, no m/r/g . No LE extremity edema   Abdomen:  . Soft, nontender, nondistended.  No hernias noted. . There is vertical midline scar extending infraumbilically. Musculoskeletal:  . RUE, LUE, RLE, LLE   o strength and tone normal, no atrophy, no abnormal movements o No tenderness, masses . Unable to sit up in bed secondary to pain. Skin:  . No rashes, lesions, ulcers . palpation of skin: no induration or  nodules Neurologic:  . Sensation bilateral lower extremities grossly intact. . Exam grossly nonfocal Psychiatric:  . Mental status o Mood, affect appropriate . Appears to be somewhat confused.   I have personally reviewed following labs and imaging studies  Labs:  . Basic metabolic panel unremarkable . Platelets 138, remainder CBC unremarkable . Covid pending . Urinalysis notable for pyuria . Urine culture drawn  Imaging studies:   Chest x-ray with right hilar opacity.  Doubt infection.  Neck no acute abnormalities.  CT abdomen pelvis shows bilateral hydroureteronephrosis extending down to the bladder, no mass.  Consider bladder outlet obstruction or infection.  CT lumbar spine L1 superior endplate compression fracture with 20% loss of height and minimal retropulsion of bone not resulting in stenosis.  Medical tests:   EKG independently reviewed: Sinus rhythm with lateral T wave inversion which appears to be new, compared to previous study rhythm was predominantly sinus although there seems to be a few paced beats.  Previous EKG showed paced rhythm.   ASSESSMENT/PLAN  Fall at home initial encounter with comminuted L1 superior endplate compression fracture 20% loss of height with associated back pain and inability to sit up or ambulate. --Pain control, neurosurgery has been consulted for further recommendations, LSO.  PT evaluation.  Bilateral hydroureteronephrosis with ureteral distention to the level of the bladder.  UTI.  Incidental findings.  No mass noted.  May be secondary to bladder outlet obstruction or infection. --Will treat for UTI.  Urology consultation for further recommendations.  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  Normal anion gap.  Sliding scale insulin.  Essential hypertension --Continue losartan and metoprolol.  Appears to be stable.  Aortic atherosclerosis --Continue statin  DVT prophylaxis: SCDs Code  Status: Full per patient Family Communication: called niece, no answer Consults called: neurosurgery     Time spent: 27 minutes  Brendia Sacks, MD  [facility] Direct contact: see [email or web address]  7PM-7AM contact night coverage as below   1. Check the care team in [facility] and look for a) attending/consulting [facility] provider listed and b) the [facility] team listed 2. Log into [email or web address] and use [facility] universal password to access. If you do not have the password, please contact the hospital operator. 3. Locate the [facility] provider you are looking for under [facility] and page to a number that you can be directly reached. 4. If you still have difficulty reaching the provider, please page the [facility] (Director on Call) for the Hospitalists listed on amion for assistance.  Severity of Illness: The appropriate patient status for this patient is OBSERVATION. Observation status is judged to be reasonable and necessary in order to provide the required intensity of service to ensure the patient's safety. The patient's presenting symptoms, physical exam findings, and initial radiographic and laboratory data  in the context of their medical condition is felt to place them at decreased risk for further clinical deterioration. Furthermore, it is anticipated that the patient will be medically stable for discharge from the hospital within 2 midnights of admission. The following factors support the patient status of observation.   " The patient's presenting symptoms include back pain. " The physical exam findings include inability to sit up secondary to pain. " The initial radiographic and laboratory data are lumbar vertebral compression fracture.    Status is: Observation  The patient remains OBS appropriate and will d/c before 2 midnights.  Dispo: The patient is from: ALF              Anticipated d/c is to: ALF              Anticipated d/c date is: 1 day              Patient currently is not  medically stable to d/c.   05/06/2019, 4:01 PM   Principal Problem:   Closed compression fracture of body of L1 vertebra (HCC) Active Problems:   Back pain   Fall at home, initial encounter   Hydroureteronephrosis   Acute lower UTI   DM type 2 (diabetes mellitus, type 2) (HCC)   Aortic stenosis   Benign essential HTN

## 2019-05-06 NOTE — ED Notes (Signed)
Pt given urinal to use restroom.

## 2019-05-06 NOTE — Progress Notes (Addendum)
Low bed ordered. Floor mats placed on the floor. Bed alarm on. Back brace applied by vendor. PRN BP med provided for elevated blood pressure. Hospitalist has been notified of high blood pressure. The patient is Aox1. Yellow bracelet applied.

## 2019-05-06 NOTE — ED Provider Notes (Signed)
[facility] Emergency Department Provider Note  ____________________________________________   First MD Initiated Contact with Patient 05/06/19 1002     (approximate)  I have reviewed the triage vital signs and the nursing notes.   HISTORY  Chief Complaint Fall    HPI Matthew Goodwin is a 86 y.o. male with BPH, diabetes, hypertension who comes in with unwitnessed fall.  Patient resides in a group home and was found down on the ground.  Sound like it was a brief amount of time on the ground.  Patient having back pain and he also reports pain in his hips.  Worse with moving his bilateral legs, constant, nothing makes it better.  Patient initially states that he thought he hit his head but then later said he did not.  He is on any blood thinners.  Denies any chest wall tenderness, abdominal tenderness.          Past Medical History:  Diagnosis Date  . Anemia   . BPH (benign prostatic hyperplasia)   . Diabetes mellitus without complication ([place])   . Edema   . Hypertension   . Hypoglycemia   . Urinary retention     There are no problems to display for this patient.   History reviewed. No pertinent surgical history.  Prior to Admission medications   Medication Sig Start Date End Date Taking? Authorizing Provider  hydrochlorothiazide (HYDRODIURIL) 25 MG tablet Take 1 tablet by mouth daily. 12/11/16   [provider]  levocetirizine (XYZAL) 5 MG tablet Take 1 tablet by mouth daily. 12/11/16   [provider]  losartan (COZAAR) 50 MG tablet Take 1 tablet by mouth daily. 12/11/16   [provider]  metoprolol succinate (TOPROL-XL) 50 MG 24 hr tablet Take 1 tablet by mouth daily. 12/11/16   [provider]  tamsulosin (FLOMAX) 0.4 MG CAPS capsule Take 1 capsule by mouth daily. 12/11/16   [provider]    Allergies Patient has no known allergies.  History reviewed. No pertinent family history.  Social  History Social History   Tobacco Use  . Smoking status: Never Smoker  . Smokeless tobacco: Never Used  Substance Use Topics  . Alcohol use: Not on file  . Drug use: Not on file      Review of Systems Constitutional: No fever/chills positive fall Eyes: No visual changes. ENT: No sore throat. Cardiovascular: Denies chest pain. Respiratory: Denies shortness of breath. Gastrointestinal: No abdominal pain.  No nausea, no vomiting.  No diarrhea.  No constipation. Genitourinary: Negative for dysuria. Musculoskeletal: Positive back pain Skin: Negative for rash. Neurological: Negative for headaches, focal weakness or numbness. All other ROS negative ____________________________________________   PHYSICAL EXAM:  VITAL SIGNS: ED Triage Vitals  Enc Vitals Group     BP 05/06/19 0959 (!) 153/99     Pulse Rate 05/06/19 0959 75     Resp 05/06/19 0959 18     Temp 05/06/19 1000 98.4 F (36.9 C)     Temp Source 05/06/19 1000 Oral     SpO2 05/06/19 0959 100 %     Weight 05/06/19 0956 190 lb (86.2 kg)     Height 05/06/19 0956 6\' 2"  (1.88 m)     Head Circumference --      Peak Flow --      Pain Score --      Pain Loc --      Pain Edu? --      Excl. in [place]? --  Constitutional: Alert and oriented. Well appearing and in no acute distress. Eyes: Conjunctivae are normal. EOMI. Head: Atraumatic. Nose: No congestion/rhinnorhea. Mouth/Throat: Mucous membranes are moist.   Neck: No stridor. Trachea Midline. FROM Cardiovascular: Normal rate, regular rhythm. Grossly normal heart sounds.  Good peripheral circulation. Respiratory: Normal respiratory effort.  No retractions. Lungs CTAB. Gastrointestinal: Soft and nontender. No distention. No abdominal bruits.  Musculoskeletal: No lower extremity tenderness nor edema.  No joint effusions.  Able to lift both legs slightly up off the bed but reports pain in his back Neurologic:  Normal speech and language. No gross focal neurologic deficits  are appreciated.  Skin:  Skin is warm, dry and intact. No rash noted. Psychiatric: Mood and affect are normal. Speech and behavior are normal. GU: Deferred  Back: Lower back tenderness ____________________________________________   LABS (all labs ordered are listed, but only abnormal results are displayed)  Labs Reviewed  CBC WITH DIFFERENTIAL/PLATELET - Abnormal; Notable for the following components:      Result Value   Platelets 138 (*)    All other components within normal limits  BASIC METABOLIC PANEL - Abnormal; Notable for the following components:   Glucose, Bld 120 (*)    Creatinine, Ser 1.29 (*)    GFR calc non Af Amer 46 (*)    GFR calc Af Amer 54 (*)    All other components within normal limits  URINALYSIS, COMPLETE (UACMP) WITH MICROSCOPIC - Abnormal; Notable for the following components:   Color, Urine YELLOW (*)    APPearance HAZY (*)    Nitrite POSITIVE (*)    Leukocytes,Ua MODERATE (*)    Bacteria, UA RARE (*)    All other components within normal limits  URINE CULTURE  RESPIRATORY PANEL BY RT PCR (FLU A&B, COVID)  HEMOGLOBIN A1C   ____________________________________________   ED ECG REPORT I, Concha Se, the attending physician, personally viewed and interpreted this ECG.  EKG is sinus rate of 60, no ST elevation, T wave inversion in 2 3 V4 through V6 with a PAC ____________________________________________  RADIOLOGY I, Concha Se, personally viewed and evaluated these images (plain radiographs) as part of my medical decision making, as well as reviewing the written report by the radiologist.  ED MD interpretation: Chest x-ray and pelvis x-ray are negative  Official radiology report(s): CT Head Wo Contrast  Result Date: 05/06/2019 CLINICAL DATA:  Polytrauma, unwitnessed fall, patient states he tripped and fell EXAM: CT HEAD WITHOUT CONTRAST CT CERVICAL SPINE WITHOUT CONTRAST TECHNIQUE: Multidetector CT imaging of the head and cervical spine was  performed following the standard protocol without intravenous contrast. Multiplanar CT image reconstructions of the cervical spine were also generated. COMPARISON:  CT head 02/13/2018 FINDINGS: CT HEAD FINDINGS Brain: Generalized atrophy. Normal ventricular morphology. No midline shift or mass effect. Small vessel chronic ischemic changes of deep cerebral white matter. Ossification of the anterior falx. 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 No intracranial hemorrhage, mass lesion, evidence of acute infarction, or extra-axial fluid collection. Vascular: Minimal atherosclerotic calcifications at skull base Skull: Intact Sinuses/Orbits: Clear Other: N/A CT CERVICAL SPINE FINDINGS Alignment: Normal Skull base and vertebrae: Osseous mineralization normal. Skull base intact. Multilevel disc space narrowing and endplate spur formation. Minimal facet degenerative changes. Vertebral body heights maintained. No fracture, subluxation or bone destruction. Soft tissues and spinal canal: Prevertebral soft tissues normal thickness Disc levels:  No additional abnormalities Upper chest: Lung apices clear Other: N/A IMPRESSION: Atrophy with small vessel chronic ischemic changes of deep cerebral white matter.  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 No acute intracranial abnormalities. Degenerative disc disease changes cervical spine. No acute cervical spine abnormalities. Electronically Signed   By: Ulyses SouthwardMark  Boles M.D.   On: 05/06/2019 13:01   CT Cervical Spine Wo Contrast  Result Date: 05/06/2019 CLINICAL DATA:  Polytrauma, unwitnessed fall, patient states he tripped and fell EXAM: CT HEAD WITHOUT CONTRAST CT CERVICAL SPINE WITHOUT CONTRAST TECHNIQUE: Multidetector CT imaging of the head and cervical spine was performed following the standard protocol without intravenous contrast. Multiplanar CT image reconstructions of the cervical spine were also generated. COMPARISON:  CT head 02/13/2018 FINDINGS: CT HEAD FINDINGS Brain: Generalized  atrophy. Normal ventricular morphology. No midline shift or mass effect. Small vessel chronic ischemic changes of deep cerebral white matter. Ossification of the anterior falx. 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 No intracranial hemorrhage, mass lesion, evidence of acute infarction, or extra-axial fluid collection. Vascular: Minimal atherosclerotic calcifications at skull base Skull: Intact Sinuses/Orbits: Clear Other: N/A CT CERVICAL SPINE FINDINGS Alignment: Normal Skull base and vertebrae: Osseous mineralization normal. Skull base intact. Multilevel disc space narrowing and endplate spur formation. Minimal facet degenerative changes. Vertebral body heights maintained. No fracture, subluxation or bone destruction. Soft tissues and spinal canal: Prevertebral soft tissues normal thickness Disc levels:  No additional abnormalities Upper chest: Lung apices clear Other: N/A IMPRESSION: Atrophy with small vessel chronic ischemic changes of deep cerebral white matter. 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 No acute intracranial abnormalities. Degenerative disc disease changes cervical spine. No acute cervical spine abnormalities. Electronically Signed   By: Ulyses SouthwardMark  Boles M.D.   On: 05/06/2019 13:01   CT ABDOMEN PELVIS W CONTRAST  Result Date: 05/06/2019 CLINICAL DATA:  Trauma. Unwitnessed fall. EXAM: CT ABDOMEN AND PELVIS WITH CONTRAST TECHNIQUE: Multidetector CT imaging of the abdomen and pelvis was performed using the standard protocol following bolus administration of intravenous contrast. CONTRAST:  100mL OMNIPAQUE IOHEXOL 300 MG/ML  SOLN COMPARISON:  05/25/2010 FINDINGS: Lower chest: Dependent atelectasis. Hepatobiliary: 5 mm hypervascular lesion identified in the dome of the left liver on image 10/series 2. 6 mm hypervascular subcapsular lesion noted posterior right liver on 17/2. There is no evidence for gallstones, gallbladder wall thickening, or pericholecystic fluid. No intrahepatic or extrahepatic biliary  dilation. Pancreas: No focal mass lesion. No dilatation of the main duct. No intraparenchymal cyst. No peripancreatic edema. Spleen: Calcified granuloma noted in the spleen. Adrenals/Urinary Tract: No adrenal nodule or mass. Cortical atrophy noted both kidneys with bilateral well-defined hypoattenuating lesions measuring up to about 13 mm, likely cyst. Mild bilateral hydroureteronephrosis evident with ureteral distention extending down to the level bladder. There is mild circumferential bladder wall thickening with apparent TURP defect in the central prostate gland. Stomach/Bowel: Stomach is unremarkable. No gastric wall thickening. No evidence of outlet obstruction. Duodenum is normally positioned as is the ligament of Treitz. No small bowel wall thickening. No small bowel dilatation. The terminal ileum is normal. The appendix is normal. No gross colonic mass. No colonic wall thickening. Vascular/Lymphatic: There is abdominal aortic atherosclerosis without aneurysm. There is no gastrohepatic or hepatoduodenal ligament lymphadenopathy. No retroperitoneal or mesenteric lymphadenopathy. No pelvic sidewall lymphadenopathy. Reproductive: The prostate gland and seminal vesicles are unremarkable. Other: No intraperitoneal free fluid. Musculoskeletal: No worrisome lytic or sclerotic osseous abnormality. IMPRESSION: 1. No evidence for acute traumatic abnormality in the abdomen or pelvis. No intraperitoneal free fluid. 2. Mild bilateral hydroureteronephrosis with ureteral distention extending down to the level of the bladder. No obstructing mass lesion evident. Mild circumferential bladder wall thickening with apparent  TURP defect in the central prostate gland. Imaging features may be related to bladder outlet obstruction or infection. 3. Bilateral renal cysts. 4. Tiny hypervascular lesions in the liver parenchyma, too small to characterize. In the absence of known primary malignancy or risk factors for primary hepatic  neoplasm, these are most likely benign. 5. Aortic Atherosclerosis (ICD10-I70.0). Electronically Signed   By: Kennith Center M.D.   On: 05/06/2019 13:07   DG Pelvis Portable  Result Date: 05/06/2019 CLINICAL DATA:  Pain following fall EXAM: PORTABLE PELVIS 1-2 VIEWS COMPARISON:  None. FINDINGS: There is no evidence of pelvic fracture or dislocation. There is slight symmetric narrowing of each hip joint. No erosive change. There is postoperative change in the pelvis. There are metallic fragments in the lower right pelvis. IMPRESSION: No appreciable fracture or dislocation. Slight symmetric narrowing each hip joint. Electronically Signed   By: Bretta Bang III M.D.   On: 05/06/2019 10:22   CT L-SPINE NO CHARGE  Result Date: 05/06/2019 CLINICAL DATA:  86 year old male status post unwitnessed fall. Left low back pain. EXAM: CT LUMBAR SPINE WITH CONTRAST TECHNIQUE: Technique: Multiplanar CT images of the lumbar spine were reconstructed from contemporary CT of the Abdomen and Pelvis. CONTRAST:  No additional COMPARISON:  CT Abdomen, and Pelvis today are reported separately. CT Abdomen and Pelvis 05/25/2010. FINDINGS: Segmentation: Normal. Alignment: Straightening of lumbar lordosis. Mild dextroconvex upper and levoconvex lower lumbar scoliosis. No spondylolisthesis. Vertebrae: Lumbar levels aside from L1 appear intact. Visible sacrum and SI joints appear intact. Visible lower thoracic levels and posterior ribs appear intact. Comminution of the L1 superior endplate extending into the central L1 body (sagittal image 22, coronal image 29) with mild loss of height (20%). The L1 pedicles and posterior elements appear intact; unfused right L1 transverse process ossification center suspected. Minimal retropulsion of bone. Paraspinal and other soft tissues: Abdominal and pelvic viscera are reported separately today. Mild paraspinal soft tissue inflammation at L1. Disc levels: Mild vacuum disc at T12-L1 might be  posttraumatic. More degenerative appearing vacuum disc at L1-L2 and L3-L4, associated with-disc space loss at each level. Overall lumbar spine degeneration is fairly age-appropriate. Up to mild multifactorial degenerative lumbar spinal stenosis at L3-L4 and L4-L5. IMPRESSION: 1. Comminuted L1 superior endplate compression fracture with 20% loss of height and minimal retropulsion of bone not resulting in stenosis. Vacuum phenomena in the T12-L1 disc may also be posttraumatic. 2. No other acute osseous abnormality in the lumbar spine. Age appropriate background lumbar spine degeneration. 3. CT Abdomen and Pelvis today reported separately. Electronically Signed   By: Odessa Fleming M.D.   On: 05/06/2019 13:03   DG Chest Portable 1 View  Result Date: 05/06/2019 CLINICAL DATA:  Unwitnessed fall, back pain EXAM: PORTABLE CHEST 1 VIEW COMPARISON:  10/23/2010 FINDINGS: Stable positioning of a left-sided implanted cardiac device. The heart size remains mildly enlarged. Atherosclerotic calcification of the aortic knob. Subtle right perihilar interstitial opacity. No pleural effusion or pneumothorax. No acute osseous findings. IMPRESSION: Subtle right perihilar interstitial opacity could reflect mild edema or infiltrate. Electronically Signed   By: Duanne Guess D.O.   On: 05/06/2019 10:25    ____________________________________________   PROCEDURES  Procedure(s) performed (including Critical Care):  Procedures   ____________________________________________   INITIAL IMPRESSION / ASSESSMENT AND PLAN / ED COURSE  Michael Walrath was evaluated in Emergency Department on 05/06/2019 for the symptoms described in the history of present illness. He was evaluated in the context of the global COVID-19 pandemic, which necessitated consideration that  the patient might be at risk for infection with the SARS-CoV-2 virus that causes COVID-19. Institutional protocols and algorithms that pertain to the evaluation of patients at  risk for COVID-19 are in a state of rapid change based on information released by regulatory bodies including the CDC and federal and state organizations. These policies and algorithms were followed during the patient's care in the ED.    Patient comes in with what sound like a mechanical fall.  Will get labs to evaluate for Electra abnormalities.  Will get chest x-ray and pelvic x-ray to make sure no signs of pelvic fracture.  Patient will need CT head evaluate for intracranial hemorrhage, CT cervical evaluate for cervical fracture and CT abdomen and pelvis to make sure no signs of intra-abdominal or lumbar fracture or pelvic fracture if x-rays are negative.  Labs are reassuring. Kidney function 1.29 but this is around his baseline CT head and neck are negative CT abdomen was negative for acute processes.  There was concern for mild bilateral hydroureteronephrosis with possible secondary to bladder obstruction versus infection.  Will get UA and do bladder scan. Patient also has a comminuted L1 superior endplate compression fracture with 20% loss of height  UA is concerning for UTI.  There is no residual urine after urinating today signs of retention.  Patient unable to ambulate.  Discussed with Dr. Marcell Barlow who recommended LSO brace.  Place consult for this.  Patient due to pain unable to stand up still able to feel his legs and slightly lift his legs up.  Discussed with the hospital team for admission and Dr. Myer Haff will consult on  Patient lives in independent living and normally ambulates on his own ____________________________________________   FINAL CLINICAL IMPRESSION(S) / ED DIAGNOSES   Final diagnoses:  Lower back pain  Closed fracture of first lumbar vertebra, unspecified fracture morphology, initial encounter (HCC)  Acute cystitis without hematuria      MEDICATIONS GIVEN DURING THIS VISIT:  Medications  lidocaine (LIDODERM) 5 % 1 patch (1 patch Transdermal Patch  Applied 05/06/19 1416)  atorvastatin (LIPITOR) tablet 10 mg (has no administration in time range)  losartan (COZAAR) tablet 50 mg (has no administration in time range)  metoprolol succinate (TOPROL-XL) 24 hr tablet 100 mg (has no administration in time range)  tamsulosin (FLOMAX) capsule 0.4 mg (has no administration in time range)  levocetirizine (XYZAL) tablet 5 mg (has no administration in time range)  fluorometholone (FML) 0.1 % ophthalmic suspension 1 drop (has no administration in time range)  insulin aspart (novoLOG) injection 0-6 Units (has no administration in time range)  sodium chloride flush (NS) 0.9 % injection 3 mL (has no administration in time range)  sodium chloride flush (NS) 0.9 % injection 3 mL (has no administration in time range)  sodium chloride flush (NS) 0.9 % injection 3 mL (has no administration in time range)  0.9 %  sodium chloride infusion (has no administration in time range)  acetaminophen (TYLENOL) tablet 650 mg (has no administration in time range)    Or  acetaminophen (TYLENOL) suppository 650 mg (has no administration in time range)  morphine 2 MG/ML injection 2 mg (has no administration in time range)  oxyCODONE (Oxy IR/ROXICODONE) immediate release tablet 5 mg (has no administration in time range)  ondansetron (ZOFRAN) tablet 4 mg (has no administration in time range)    Or  ondansetron (ZOFRAN) injection 4 mg (has no administration in time range)  cephALEXin (KEFLEX) capsule 500 mg (has no administration in time  range)  acetaminophen (TYLENOL) tablet 1,000 mg (1,000 mg Oral Given 05/06/19 1026)  iohexol (OMNIPAQUE) 300 MG/ML solution 100 mL (100 mLs Intravenous Contrast Given 05/06/19 1218)  cefTRIAXone (ROCEPHIN) 1 g in sodium chloride 0.9 % 100 mL IVPB (0 g Intravenous Stopped 05/06/19 1536)     ED Discharge Orders    None       Note:  This document was prepared using Dragon voice recognition software and may include unintentional dictation errors.     Concha Se, MD 05/06/19 [phone]
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
## 2019-05-06 NOTE — Progress Notes (Signed)
Orthopedic Tech Progress Note Patient Details:  Diarra Kos 11/15/1920 [id number] Called in order to [facility] for a LSO/VERTALIGN  Patient ID: Adham Johnson, male   DOB: 25-Nov-1920, 86 y.o.   MRN: [id number]   Donald Pore 05/06/2019, 4:19 PM

## 2019-05-06 NOTE — ED Notes (Signed)
Pt attempted to walk but was unable to even sit up without assistance due to the pain. MD notified.

## 2019-05-06 NOTE — ED Triage Notes (Signed)
Pt comes from [place] Years after an unwitnessed fall. Pt states that he tripped and fell. Only c/o left lower back pain. Pt AOx4 per EMS. CBG 122. Hypertensive for EMS.

## 2019-05-07 DIAGNOSIS — I350 Nonrheumatic aortic (valve) stenosis: Secondary | ICD-10-CM | POA: Diagnosis present

## 2019-05-07 DIAGNOSIS — Y92009 Unspecified place in unspecified non-institutional (private) residence as the place of occurrence of the external cause: Secondary | ICD-10-CM | POA: Diagnosis not present

## 2019-05-07 DIAGNOSIS — S32010A Wedge compression fracture of first lumbar vertebra, initial encounter for closed fracture: Secondary | ICD-10-CM | POA: Diagnosis not present

## 2019-05-07 DIAGNOSIS — F0390 Unspecified dementia without behavioral disturbance: Secondary | ICD-10-CM | POA: Diagnosis present

## 2019-05-07 DIAGNOSIS — N400 Enlarged prostate without lower urinary tract symptoms: Secondary | ICD-10-CM | POA: Diagnosis present

## 2019-05-07 DIAGNOSIS — N1831 Chronic kidney disease, stage 3a: Secondary | ICD-10-CM | POA: Diagnosis present

## 2019-05-07 DIAGNOSIS — E1122 Type 2 diabetes mellitus with diabetic chronic kidney disease: Secondary | ICD-10-CM | POA: Diagnosis present

## 2019-05-07 DIAGNOSIS — I700 Atherosclerosis of aorta: Secondary | ICD-10-CM | POA: Diagnosis present

## 2019-05-07 DIAGNOSIS — N390 Urinary tract infection, site not specified: Secondary | ICD-10-CM | POA: Diagnosis not present

## 2019-05-07 DIAGNOSIS — M549 Dorsalgia, unspecified: Secondary | ICD-10-CM | POA: Diagnosis present

## 2019-05-07 DIAGNOSIS — Z79899 Other long term (current) drug therapy: Secondary | ICD-10-CM | POA: Diagnosis not present

## 2019-05-07 DIAGNOSIS — M48061 Spinal stenosis, lumbar region without neurogenic claudication: Secondary | ICD-10-CM | POA: Diagnosis present

## 2019-05-07 DIAGNOSIS — N179 Acute kidney failure, unspecified: Secondary | ICD-10-CM | POA: Diagnosis present

## 2019-05-07 DIAGNOSIS — D696 Thrombocytopenia, unspecified: Secondary | ICD-10-CM | POA: Diagnosis present

## 2019-05-07 DIAGNOSIS — M419 Scoliosis, unspecified: Secondary | ICD-10-CM | POA: Diagnosis present

## 2019-05-07 DIAGNOSIS — N136 Pyonephrosis: Secondary | ICD-10-CM | POA: Diagnosis present

## 2019-05-07 DIAGNOSIS — W19XXXA Unspecified fall, initial encounter: Secondary | ICD-10-CM | POA: Diagnosis present

## 2019-05-07 DIAGNOSIS — I129 Hypertensive chronic kidney disease with stage 1 through stage 4 chronic kidney disease, or unspecified chronic kidney disease: Secondary | ICD-10-CM | POA: Diagnosis present

## 2019-05-07 DIAGNOSIS — Z20822 Contact with and (suspected) exposure to covid-19: Secondary | ICD-10-CM | POA: Diagnosis present

## 2019-05-07 DIAGNOSIS — N289 Disorder of kidney and ureter, unspecified: Secondary | ICD-10-CM | POA: Diagnosis present

## 2019-05-07 DIAGNOSIS — S32020A Wedge compression fracture of second lumbar vertebra, initial encounter for closed fracture: Secondary | ICD-10-CM | POA: Diagnosis present

## 2019-05-07 DIAGNOSIS — M4856XA Collapsed vertebra, not elsewhere classified, lumbar region, initial encounter for fracture: Secondary | ICD-10-CM | POA: Diagnosis present

## 2019-05-07 DIAGNOSIS — Z23 Encounter for immunization: Secondary | ICD-10-CM | POA: Diagnosis present

## 2019-05-07 DIAGNOSIS — E1121 Type 2 diabetes mellitus with diabetic nephropathy: Secondary | ICD-10-CM | POA: Diagnosis not present

## 2019-05-07 DIAGNOSIS — H9190 Unspecified hearing loss, unspecified ear: Secondary | ICD-10-CM | POA: Diagnosis present

## 2019-05-07 LAB — GLUCOSE, CAPILLARY
Glucose-Capillary: 80 mg/dL (ref 70–99)
Glucose-Capillary: 104 mg/dL — ABNORMAL HIGH (ref 70–99)
Glucose-Capillary: 69 mg/dL — ABNORMAL LOW (ref 70–99)
Glucose-Capillary: 71 mg/dL (ref 70–99)
Glucose-Capillary: 97 mg/dL (ref 70–99)

## 2019-05-07 NOTE — Evaluation (Signed)
Occupational Therapy Evaluation Patient Details Name: Matthew Goodwin MRN: [id number] DOB: July 24, 1920 Today's Date: 05/07/2019    History of Present Illness Matthew Goodwin is a 86 y.o. ALF resident who presented to the ED with pack pain and decreased mobility. after sustaining a fall yesterday. Immaging revealed  a closed compression fracture of the body of L1. PMH includes DM II, dementia, AKI   Clinical Impression   Matthew Goodwin was seen for OT evaluation this date. Pt received semisupine in bed. He remains pleasantly confused t/o OT evaluation, and is unable to provide information regarding PLOF/home set-up. Per chart, pt resides at an ALF, and required assistance for bathing and uses a [facility] for functional mobility at baseline. Otherwise, pt is generally modified independent for other ADL tasks. Currently pt demonstrates impairments as described below (See OT problem list) which functionally limit his ability to perform ADL/self-care tasks. Pt currently requires MOD A for bed mobility and functional mobility attempts, as well as MOD A for LB ADL management in seated position.  Pt would benefit from skilled OT to address noted impairments and functional limitations (see below for any additional details) in order to maximize safety and independence while minimizing falls risk and caregiver burden. Upon hospital discharge, recommend STR upon hospital DC.     Follow Up Recommendations  Supervision - Intermittent; SNF    Equipment Recommendations  Other (comment)(TBD)    Recommendations for Other Services       Precautions / Restrictions Precautions Precautions: Fall Precaution Comments: High fall Required Braces or Orthoses: Spinal Brace Spinal Brace: Lumbar corset Restrictions Weight Bearing Restrictions: No      Mobility Bed Mobility Overal bed mobility: Needs Assistance Bed Mobility: Rolling;Sidelying to Sit Rolling: Min assist Sidelying to sit: Mod assist;HOB elevated        General bed mobility comments: MOD A to bring BLE over EOB and to assist with trunk elevation.  Transfers Overall transfer level: Needs assistance Equipment used: Rolling walker (2 wheeled) Transfers: Sit to/from Stand Sit to Stand: Mod assist         General transfer comment: Pt requires MOD A for lift-off from bed at elevated position this date x2. Is able to side-step at EOB given min A and assist to advance walker.    Balance Overall balance assessment: Needs assistance Sitting-balance support: Feet supported;Single extremity supported Sitting balance-Leahy Scale: Fair Sitting balance - Comments: Pt generally maintains at least 1 UE support while seated EOB.   Standing balance support: During functional activity;Bilateral upper extremity supported Standing balance-Leahy Scale: Fair Standing balance comment: Heavy BUE use on RW.                           ADL either performed or assessed with clinical judgement   ADL Overall ADL's : Needs assistance/impaired                                       General ADL Comments: Pt significantly functionally limited by increased low back pain with mobility, generalized weakness, and decreased activity tolerance. MOD A for LB ADL in seated position. MOD A for bed and fxl mobility. Set-up/SUP for seated UB ADL management.     Vision Patient Visual Report: No change from baseline       Perception     Praxis      Pertinent Vitals/Pain Pain Assessment: Faces Faces  Pain Scale: Hurts whole lot Pain Location: Back pain with mobility Pain Descriptors / Indicators: Grimacing;Guarding;Sore Pain Intervention(s): Limited activity within patient's tolerance;Monitored during session;Repositioned;Premedicated before session     Hand Dominance     Extremity/Trunk Assessment Upper Extremity Assessment Upper Extremity Assessment: Generalized weakness   Lower Extremity Assessment Lower Extremity Assessment:  Generalized weakness   Cervical / Trunk Assessment Cervical / Trunk Assessment: Kyphotic   Communication Communication Communication: HOH   Cognition Arousal/Alertness: Awake/alert Behavior During Therapy: WFL for tasks assessed/performed Overall Cognitive Status: History of cognitive impairments - at baseline                                 General Comments: Pt with hx of dementiona. Remains pleasantly confused t/o evaluation. Oriented to self only. Requires consistent multimodal cueing to participate in therapy. Significantly pain limited, but able to attempt functional tasks. No caregiver present to determine baseline.   General Comments       Exercises Other Exercises Other Exercises: OT attempts education regarding adapted bed mobility "log roll" technique to maximize pt safety and comfort. Pt rquires MAX multimodal cueing but is generally able to follow steps to perform bed mobility. Additional education limited due to pt cognitive status.   Shoulder Instructions      Home Living Family/patient expects to be discharged to:: Assisted living                             Home Equipment: [employer] Mor - single point   Additional Comments: Per chart, pt requires some assist with bathing, but is otherwise MOD I with cane for functional mobility. Pt unable to provide information regarding PLOF/home set-up 2/2 impaired cognition.      Prior Functioning/Environment Level of Independence: Independent with assistive device(s)                 OT Problem List: Decreased activity tolerance;Decreased safety awareness;Decreased coordination;Decreased strength;Decreased range of motion;Impaired balance (sitting and/or standing);Decreased knowledge of use of DME or AE;Pain      OT Treatment/Interventions: Self-care/ADL training;Therapeutic exercise;Therapeutic activities;DME and/or AE instruction;Patient/family education;Balance training    OT Goals(Current goals  can be found in the care plan section) Acute Rehab OT Goals Patient Stated Goal: To have less pain in my back OT Goal Formulation: With patient Time For Goal Achievement: 05/21/19 Potential to Achieve Goals: Good ADL Goals Pt Will Perform Grooming: sitting;with modified independence Pt Will Perform Lower Body Dressing: sit to/from stand;with set-up;with supervision;with adaptive equipment(LRAD PRN for safety) Pt Will Transfer to Toilet: with set-up;with supervision;ambulating;bedside commode(LRAD PRN for safety) Pt Will Perform Toileting - Clothing Manipulation and hygiene: sit to/from stand;with supervision;with set-up;with adaptive equipment(LRAD PRN for safety)  OT Frequency: Min 2X/week   Barriers to D/C:            Co-evaluation              AM-PAC OT "6 Clicks" Daily Activity     Outcome Measure Help from another person eating meals?: A Little Help from another person taking care of personal grooming?: A Little Help from another person toileting, which includes using toliet, bedpan, or urinal?: A Lot Help from another person bathing (including washing, rinsing, drying)?: A Lot Help from another person to put on and taking off regular upper body clothing?: A Little Help from another person to put on and taking off regular lower body clothing?:  A Lot 6 Click Score: 15   End of Session Equipment Utilized During Treatment: Gait belt;Rolling walker Nurse Communication: Mobility status  Activity Tolerance: Patient tolerated treatment well Patient left: in bed;with call bell/phone within reach;with bed alarm set;Other (comment)(With safety mats on floor.)  OT Visit Diagnosis: Other abnormalities of gait and mobility (R26.89);History of falling (Z91.81);Pain Pain - Right/Left: (both) Pain - part of body: (back)                Time: 6979-4801 OT Time Calculation (min): 29 min Charges:  OT General Charges $OT Visit: 1 Visit OT Evaluation $OT Eval Moderate Complexity: 1  Mod OT Treatments $Self Care/Home Management : 8-22 mins  Shara Blazing, M.S., OTR/L Ascom: [phone] 05/07/19, 3:03 PM

## 2019-05-07 NOTE — Evaluation (Signed)
Physical Therapy Evaluation Patient Details Name: Matthew Goodwin MRN: [id number] DOB: [birth year] Today's Date: 05/07/2019   History of Present Illness  Matthew Goodwin is a 86 y.o. ALF resident who presented to the ED with pack pain and decreased mobility. after sustaining a fall yesterday. Immaging revealed  a closed compression fracture of the body of L1. PMH includes DM II, dementia, AKI  Clinical Impression  Patient received in bed sleeping upon arrival. Roused with touch. Patient groggy initially asking about his breakfast, britches. Patient is very HOH, attempted to orient him to place and situation. He required mod assist for supine to sit. Difficulty following direction for hand placement to perform log roll. Unable to follow through with that technique.  He declined attempt to stand up as he was in pain and disoriented. He will continue to benefit from skilled  PT to improve functional independence for return to ALF if able at discharge. Based on his current mobility he will need SNF.       Follow Up Recommendations SNF    Equipment Recommendations  Other (comment)(TBD at next venue)    Recommendations for Other Services       Precautions / Restrictions Precautions Precautions: Fall Precaution Comments: High fall Required Braces or Orthoses: Spinal Brace Spinal Brace: Lumbar corset Restrictions Weight Bearing Restrictions: No      Mobility  Bed Mobility Overal bed mobility: Needs Assistance Bed Mobility: Rolling;Sit to Supine;Sidelying to Sit Rolling: Mod assist Sidelying to sit: Mod assist Supine to sit: Mod assist Sit to supine: Mod assist   General bed mobility comments: MOD A to bring BLE over EOB and to assist with trunk elevation. Hand over hand trying to get patient to roll to his side to sit up, he could not perform this way. Preferred to pull up on me using my hand to assist.  Transfers Overall transfer level: Needs assistance Equipment used: Rolling walker (2  wheeled) Transfers: Sit to/from Stand Sit to Stand: Mod assist         General transfer comment: unable to attempt. Wanted to lie back down after sitting up on the side of the bed.  Ambulation/Gait             General Gait Details: unable this session  Stairs            Wheelchair Mobility    Modified Rankin (Stroke Patients Only)       Balance Overall balance assessment: Needs assistance Sitting-balance support: Feet supported Sitting balance-Leahy Scale: Fair Sitting balance - Comments: Pt generally maintains at least 1 UE support while seated EOB.   Standing balance support: During functional activity;Bilateral upper extremity supported Standing balance-Leahy Scale: Fair Standing balance comment: Heavy BUE use on RW.                             Pertinent Vitals/Pain Pain Assessment: Faces Faces Pain Scale: Hurts whole lot Pain Location: Back pain with mobility Pain Descriptors / Indicators: Grimacing;Guarding;Moaning Pain Intervention(s): Monitored during session;Repositioned    Home Living Family/patient expects to be discharged to:: Assisted living               Home Equipment: [employer] - single point Additional Comments: Per chart, pt requires some assist with bathing, but is otherwise MOD I with cane for functional mobility. Pt unable to provide information regarding PLOF/home set-up 2/2 impaired cognition.    Prior Function Level of Independence: Independent with assistive device(s)  Hand Dominance        Extremity/Trunk Assessment   Upper Extremity Assessment Upper Extremity Assessment: Generalized weakness    Lower Extremity Assessment Lower Extremity Assessment: Generalized weakness    Cervical / Trunk Assessment Cervical / Trunk Assessment: Kyphotic  Communication   Communication: HOH  Cognition Arousal/Alertness: Lethargic Behavior During Therapy: WFL for tasks assessed/performed Overall  Cognitive Status: History of cognitive impairments - at baseline                                 General Comments: patient sleeping upon arrival to room. Pt with hx of dementia. Remains pleasantly confused t/o evaluation. Oriented to self only. Requires consistent multimodal cueing to participate in therapy. Significantly pain limited, but able to attempt functional tasks. No caregiver present to determine baseline.      General Comments      Exercises Other Exercises Other Exercises: OT attempts education regarding adapted bed mobility "log roll" technique to maximize pt safety and comfort. Pt rquires MAX multimodal cueing but is generally able to follow steps to perform bed mobility. Additional education limited due to pt cognitive status.   Assessment/Plan    PT Assessment Patient needs continued PT services  PT Problem List Decreased strength;Decreased mobility;Decreased activity tolerance;Decreased cognition;Pain;Decreased safety awareness;Decreased knowledge of use of DME       PT Treatment Interventions Therapeutic activities;Gait training;Therapeutic exercise;Functional mobility training;Patient/family education    PT Goals (Current goals can be found in the Care Plan section)  Acute Rehab PT Goals Patient Stated Goal: To have less pain in my back PT Goal Formulation: Patient unable to participate in goal setting Time For Goal Achievement: 05/21/19 Potential to Achieve Goals: Fair    Frequency Min 2X/week   Barriers to discharge Decreased caregiver support      Co-evaluation               AM-PAC PT "6 Clicks" Mobility  Outcome Measure Help needed turning from your back to your side while in a flat bed without using bedrails?: A Lot Help needed moving from lying on your back to sitting on the side of a flat bed without using bedrails?: A Lot Help needed moving to and from a bed to a chair (including a wheelchair)?: A Lot Help needed standing up  from a chair using your arms (e.g., wheelchair or bedside chair)?: A Lot Help needed to walk in hospital room?: Total Help needed climbing 3-5 steps with a railing? : Total 6 Click Score: 10    End of Session   Activity Tolerance: Patient limited by pain Patient left: in bed;with bed alarm set;with call bell/phone within reach Nurse Communication: Mobility status;Other (comment)(patient having a lot of pain with mobility attempt) PT Visit Diagnosis: Other abnormalities of gait and mobility (R26.89);Pain;Difficulty in walking, not elsewhere classified (R26.2);Muscle weakness (generalized) (M62.81);History of falling (Z91.81) Pain - part of body: (back)    Time: 2979-8921 PT Time Calculation (min) (ACUTE ONLY): 21 min   Charges:   PT Evaluation $PT Eval Moderate Complexity: 1 Mod PT Treatments $Therapeutic Activity: 8-22 mins        Raina Sole, PT, GCS 05/07/19,3:17 PM

## 2019-05-07 NOTE — Progress Notes (Signed)
Per aid after adjusting patient in bed. We have found that patient appears to have pulled or lost his IV.

## 2019-05-07 NOTE — Progress Notes (Signed)
PROGRESS NOTE    Matthew Goodwin  FSF:[id number] DOB: 30-Dec-1920 DOA: 05/06/2019 PCP: Casilda Carls, MD (Confirm with patient/family/NH records and if not entered, this HAS to be entered at [facility] point of entry. "No PCP" if truly none.)   Brief Narrative: (Start on day 1 of progress note - keep it brief and live) Patient is a 86 year old male with history of type 2 diabetes, essential hypertension, benign prostate hydrophilia, type 2 diabetes who was admitted for back pain after a fall, CT scan showed L1 compression fracture.   Assessment & Plan:   Principal Problem:   Closed compression fracture of body of L1 vertebra (HCC) Active Problems:   Back pain   Fall at home, initial encounter   Hydroureteronephrosis   Acute lower UTI   DM type 2 (diabetes mellitus, type 2) (HCC)   Aortic stenosis   Benign essential HTN  #1.  L1 compression fracture at superior endplate with 33% loss of height secondary to fall at home. Asked with Dr.Yarbrough, he currently does not need surgery.  We also discussed about patient CT scan results, he is not a concern for spinal stenosis. Patient has significant urinary retention, probably not from spinal stenosis.  He is not a surgery candidate if there is any spinal damage.  Therefore, I will withhold additional work-up with spinal MRI. Continue symptomatic treatment.  Continue physical therapy.  2.  Urinary retention with bilateral hydroureteronephrosis. This appears to be secondary to combination of benign prostate hypertrophy and a possible UTI.  Continue to treat acute UTI.  I personally discussed with the nurse, will perform bladder scan, will anchor Foley catheter if there is significant urinary retention.  Urine culture results pending.  3.  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 Continue sliding scale insulin.  4. Essential hypertension. Continue some home medicines.  5.  Chronic dementia. Continue to follow.  6. Mild  thrombocytopenia. Repeat CBC tomorrow.  7.  Acute kidney injury. Likely secondary to urinary retention.  As above.  DVT prophylaxis: SCDs Code Status: Full Family Communication:   Disposition Plan:  . [facility]            . Anticipated d/c place: ALF . Barriers to d/c OR conditions which need to be met to effect a safe d/c:   Consultants:   Neurosurgery  Urology  Procedures: None Antimicrobials: Rocephin. Subjective: Patient is a quite confused, he is only oriented to himself.  He states that his pain is not as severe.  Discussed with the nurse, patient does not have any short of breath or cough.  No diarrhea.  Objective: Vitals:   05/06/19 2037 05/07/19 0024 05/07/19 0428 05/07/19 0747  BP: (!) 179/100 (!) 147/86 140/87 (!) 163/87  Pulse: 65 60 61 76  Resp: '16 16 20 16  '$ Temp: 98.6 F (37 C) 98 F (36.7 C) 98.9 F (37.2 C)   TempSrc: Oral Oral Oral   SpO2: 98% 99% 99% 98%  Weight:      Height:        Intake/Output Summary (Last 24 hours) at 05/07/2019 0905 Last data filed at 05/07/2019 0855 Gross per 24 hour  Intake 373 ml  Output 770 ml  Net -397 ml   Filed Weights   05/06/19 0956  Weight: 86.2 kg    Examination:  General exam: Appears calm and comfortable  Respiratory system: Clear to auscultation. Respiratory effort normal. Cardiovascular system: S1 & S2 heard, RRR. No JVD, murmurs,  rubs, gallops or clicks. No pedal edema. Gastrointestinal system: Abdomen is nondistended, soft and nontender. No organomegaly or masses felt. Normal bowel sounds heard. Central nervous system: Alert and confused. No focal neurological deficits. Extremities: Symmetric  Skin: No rashes, lesions or ulcers Psychiatry:  Mood appropriate.     Data Reviewed: I have personally reviewed following labs and imaging studies  CBC: Recent Labs  Lab 05/06/19 1007  WBC 6.3  NEUTROABS 4.5  HGB 13.2  HCT 39.6  MCV 91.5  PLT 138*   Basic  Metabolic Panel: Recent Labs  Lab 05/06/19 1007  NA 138  K 5.1  CL 107  CO2 25  GLUCOSE 120*  BUN 18  CREATININE 1.29*  CALCIUM 9.2   GFR: Estimated Creatinine Clearance: 38.9 mL/min (A) (by C-G formula based on SCr of 1.29 mg/dL (H)). Liver Function Tests: No results for input(s): AST, ALT, ALKPHOS, BILITOT, PROT, ALBUMIN in the last 168 hours. No results for input(s): LIPASE, AMYLASE in the last 168 hours. No results for input(s): AMMONIA in the last 168 hours. Coagulation Profile: No results for input(s): INR, PROTIME in the last 168 hours. Cardiac Enzymes: No results for input(s): CKTOTAL, CKMB, CKMBINDEX, TROPONINI in the last 168 hours. BNP (last 3 results) No results for input(s): PROBNP in the last 8760 hours. HbA1C: Recent Labs    05/06/19 1007  HGBA1C 6.0*   CBG: Recent Labs  Lab 05/06/19 1638 05/06/19 2043 05/07/19 0808  GLUCAP 80 111* 80   Lipid Profile: No results for input(s): CHOL, HDL, LDLCALC, TRIG, CHOLHDL, LDLDIRECT in the last 72 hours. Thyroid Function Tests: No results for input(s): TSH, T4TOTAL, FREET4, T3FREE, THYROIDAB in the last 72 hours. Anemia Panel: No results for input(s): VITAMINB12, FOLATE, FERRITIN, TIBC, IRON, RETICCTPCT in the last 72 hours. Sepsis Labs: No results for input(s): PROCALCITON, LATICACIDVEN in the last 168 hours.  Recent Results (from the past 240 hour(s))  Respiratory Panel by RT PCR (Flu A&B, Covid) - Nasopharyngeal Swab     Status: None   Collection Time: 05/06/19  3:37 PM   Specimen: Nasopharyngeal Swab  Result Value Ref Range Status   SARS Coronavirus 2 by RT PCR NEGATIVE NEGATIVE Final    Comment: (NOTE) SARS-CoV-2 target nucleic acids are NOT DETECTED. The SARS-CoV-2 RNA is generally detectable in upper respiratoy specimens during the acute phase of infection. The lowest concentration of SARS-CoV-2 viral copies this assay can detect is 131 copies/mL. A negative result does not preclude  SARS-Cov-2 infection and should not be used as the sole basis for treatment or other patient management decisions. A negative result may occur with  improper specimen collection/handling, submission of specimen other than nasopharyngeal swab, presence of viral mutation(s) within the areas targeted by this assay, and inadequate number of viral copies (<131 copies/mL). A negative result must be combined with clinical observations, patient history, and epidemiological information. The expected result is Negative. Fact Sheet for Patients:  PinkCheek.be Fact Sheet for Healthcare Providers:  GravelBags.it This test is not yet ap proved or cleared by the [place] FDA and  has been authorized for detection and/or diagnosis of SARS-CoV-2 by FDA under an Emergency Use Authorization (EUA). This EUA will remain  in effect (meaning this test can be used) for the duration of the COVID-19 declaration under Section 564(b)(1) of the Act, 21 U.S.C. section 360bbb-3(b)(1), unless the authorization is terminated or revoked sooner.    Influenza A by PCR NEGATIVE NEGATIVE Final   Influenza B by PCR NEGATIVE NEGATIVE Final  Comment: (NOTE) The Xpert Xpress SARS-CoV-2/FLU/RSV assay is intended as an aid in  the diagnosis of influenza from Nasopharyngeal swab specimens and  should not be used as a sole basis for treatment. Nasal washings and  aspirates are unacceptable for Xpert Xpress SARS-CoV-2/FLU/RSV  testing. Fact Sheet for Patients: PinkCheek.be Fact Sheet for Healthcare Providers: GravelBags.it This test is not yet approved or cleared by the [place] FDA and  has been authorized for detection and/or diagnosis of SARS-CoV-2 by  FDA under an Emergency Use Authorization (EUA). This EUA will remain  in effect (meaning this test can be used) for the duration of the  Covid-19  declaration under Section 564(b)(1) of the Act, 21  U.S.C. section 360bbb-3(b)(1), unless the authorization is  terminated or revoked. Performed at [facility], [address] [place], [place] [address]          Radiology Studies: CT Head Wo Contrast  Result Date: 05/06/2019 CLINICAL DATA:  Polytrauma, unwitnessed fall, patient states he tripped and fell EXAM: CT HEAD WITHOUT CONTRAST CT CERVICAL SPINE WITHOUT CONTRAST TECHNIQUE: Multidetector CT imaging of the head and cervical spine was performed following the standard protocol without intravenous contrast. Multiplanar CT image reconstructions of the cervical spine were also generated. COMPARISON:  CT head 02/13/2018 FINDINGS: CT HEAD FINDINGS Brain: Generalized atrophy. Normal ventricular morphology. No midline shift or mass effect. Small vessel chronic ischemic changes of deep cerebral white matter. Ossification of the anterior falx. 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 No intracranial hemorrhage, mass lesion, evidence of acute infarction, or extra-axial fluid collection. Vascular: Minimal atherosclerotic calcifications at skull base Skull: Intact Sinuses/Orbits: Clear Other: N/A CT CERVICAL SPINE FINDINGS Alignment: Normal Skull base and vertebrae: Osseous mineralization normal. Skull base intact. Multilevel disc space narrowing and endplate spur formation. Minimal facet degenerative changes. Vertebral body heights maintained. No fracture, subluxation or bone destruction. Soft tissues and spinal canal: Prevertebral soft tissues normal thickness Disc levels:  No additional abnormalities Upper chest: Lung apices clear Other: N/A IMPRESSION: Atrophy with small vessel chronic ischemic changes of deep cerebral white matter. 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 No acute intracranial abnormalities. Degenerative disc disease changes cervical spine. No acute cervical spine abnormalities. Electronically Signed   By: Lavonia Dana M.D.   On:  05/06/2019 13:01   CT Cervical Spine Wo Contrast  Result Date: 05/06/2019 CLINICAL DATA:  Polytrauma, unwitnessed fall, patient states he tripped and fell EXAM: CT HEAD WITHOUT CONTRAST CT CERVICAL SPINE WITHOUT CONTRAST TECHNIQUE: Multidetector CT imaging of the head and cervical spine was performed following the standard protocol without intravenous contrast. Multiplanar CT image reconstructions of the cervical spine were also generated. COMPARISON:  CT head 02/13/2018 FINDINGS: CT HEAD FINDINGS Brain: Generalized atrophy. Normal ventricular morphology. No midline shift or mass effect. Small vessel chronic ischemic changes of deep cerebral white matter. Ossification of the anterior falx. 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 No intracranial hemorrhage, mass lesion, evidence of acute infarction, or extra-axial fluid collection. Vascular: Minimal atherosclerotic calcifications at skull base Skull: Intact Sinuses/Orbits: Clear Other: N/A CT CERVICAL SPINE FINDINGS Alignment: Normal Skull base and vertebrae: Osseous mineralization normal. Skull base intact. Multilevel disc space narrowing and endplate spur formation. Minimal facet degenerative changes. Vertebral body heights maintained. No fracture, subluxation or bone destruction. Soft tissues and spinal canal: Prevertebral soft tissues normal thickness Disc levels:  No additional abnormalities Upper chest: Lung apices clear Other: N/A IMPRESSION: Atrophy with small vessel chronic ischemic changes of deep cerebral white matter. 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  No acute intracranial abnormalities. Degenerative disc disease changes cervical spine. No acute cervical spine abnormalities. Electronically Signed   By: Lavonia Dana M.D.   On: 05/06/2019 13:01   CT ABDOMEN PELVIS W CONTRAST  Result Date: 05/06/2019 CLINICAL DATA:  Trauma. Unwitnessed fall. EXAM: CT ABDOMEN AND PELVIS WITH CONTRAST TECHNIQUE: Multidetector CT imaging of the abdomen and pelvis was  performed using the standard protocol following bolus administration of intravenous contrast. CONTRAST:  139m OMNIPAQUE IOHEXOL 300 MG/ML  SOLN COMPARISON:  05/25/2010 FINDINGS: Lower chest: Dependent atelectasis. Hepatobiliary: 5 mm hypervascular lesion identified in the dome of the left liver on image 10/series 2. 6 mm hypervascular subcapsular lesion noted posterior right liver on 17/2. There is no evidence for gallstones, gallbladder wall thickening, or pericholecystic fluid. No intrahepatic or extrahepatic biliary dilation. Pancreas: No focal mass lesion. No dilatation of the main duct. No intraparenchymal cyst. No peripancreatic edema. Spleen: Calcified granuloma noted in the spleen. Adrenals/Urinary Tract: No adrenal nodule or mass. Cortical atrophy noted both kidneys with bilateral well-defined hypoattenuating lesions measuring up to about 13 mm, likely cyst. Mild bilateral hydroureteronephrosis evident with ureteral distention extending down to the level bladder. There is mild circumferential bladder wall thickening with apparent TURP defect in the central prostate gland. Stomach/Bowel: Stomach is unremarkable. No gastric wall thickening. No evidence of outlet obstruction. Duodenum is normally positioned as is the ligament of Treitz. No small bowel wall thickening. No small bowel dilatation. The terminal ileum is normal. The appendix is normal. No gross colonic mass. No colonic wall thickening. Vascular/Lymphatic: There is abdominal aortic atherosclerosis without aneurysm. There is no gastrohepatic or hepatoduodenal ligament lymphadenopathy. No retroperitoneal or mesenteric lymphadenopathy. No pelvic sidewall lymphadenopathy. Reproductive: The prostate gland and seminal vesicles are unremarkable. Other: No intraperitoneal free fluid. Musculoskeletal: No worrisome lytic or sclerotic osseous abnormality. IMPRESSION: 1. No evidence for acute traumatic abnormality in the abdomen or pelvis. No intraperitoneal  free fluid. 2. Mild bilateral hydroureteronephrosis with ureteral distention extending down to the level of the bladder. No obstructing mass lesion evident. Mild circumferential bladder wall thickening with apparent TURP defect in the central prostate gland. Imaging features may be related to bladder outlet obstruction or infection. 3. Bilateral renal cysts. 4. Tiny hypervascular lesions in the liver parenchyma, too small to characterize. In the absence of known primary malignancy or risk factors for primary hepatic neoplasm, these are most likely benign. 5. Aortic Atherosclerosis (ICD10-I70.0). Electronically Signed   By: EMisty StanleyM.D.   On: 05/06/2019 13:07   DG Pelvis Portable  Result Date: 05/06/2019 CLINICAL DATA:  Pain following fall EXAM: PORTABLE PELVIS 1-2 VIEWS COMPARISON:  None. FINDINGS: There is no evidence of pelvic fracture or dislocation. There is slight symmetric narrowing of each hip joint. No erosive change. There is postoperative change in the pelvis. There are metallic fragments in the lower right pelvis. IMPRESSION: No appreciable fracture or dislocation. Slight symmetric narrowing each hip joint. Electronically Signed   By: WLowella GripIII M.D.   On: 05/06/2019 10:22   CT L-SPINE NO CHARGE  Result Date: 05/06/2019 CLINICAL DATA:  86year old male status post unwitnessed fall. Left low back pain. EXAM: CT LUMBAR SPINE WITH CONTRAST TECHNIQUE: Technique: Multiplanar CT images of the lumbar spine were reconstructed from contemporary CT of the Abdomen and Pelvis. CONTRAST:  No additional COMPARISON:  CT Abdomen, and Pelvis today are reported separately. CT Abdomen and Pelvis 05/25/2010. FINDINGS: Segmentation: Normal. Alignment: Straightening of lumbar lordosis. Mild dextroconvex upper and levoconvex lower lumbar scoliosis. No  spondylolisthesis. Vertebrae: Lumbar levels aside from L1 appear intact. Visible sacrum and SI joints appear intact. Visible lower thoracic levels and  posterior ribs appear intact. Comminution of the L1 superior endplate extending into the central L1 body (sagittal image 22, coronal image 29) with mild loss of height (20%). The L1 pedicles and posterior elements appear intact; unfused right L1 transverse process ossification center suspected. Minimal retropulsion of bone. Paraspinal and other soft tissues: Abdominal and pelvic viscera are reported separately today. Mild paraspinal soft tissue inflammation at L1. Disc levels: Mild vacuum disc at T12-L1 might be posttraumatic. More degenerative appearing vacuum disc at L1-L2 and L3-L4, associated with-disc space loss at each level. Overall lumbar spine degeneration is fairly age-appropriate. Up to mild multifactorial degenerative lumbar spinal stenosis at L3-L4 and L4-L5. IMPRESSION: 1. Comminuted L1 superior endplate compression fracture with 20% loss of height and minimal retropulsion of bone not resulting in stenosis. Vacuum phenomena in the T12-L1 disc may also be posttraumatic. 2. No other acute osseous abnormality in the lumbar spine. Age appropriate background lumbar spine degeneration. 3. CT Abdomen and Pelvis today reported separately. Electronically Signed   By: Genevie Ann M.D.   On: 05/06/2019 13:03   DG Chest Portable 1 View  Result Date: 05/06/2019 CLINICAL DATA:  Unwitnessed fall, back pain EXAM: PORTABLE CHEST 1 VIEW COMPARISON:  10/23/2010 FINDINGS: Stable positioning of a left-sided implanted cardiac device. The heart size remains mildly enlarged. Atherosclerotic calcification of the aortic knob. Subtle right perihilar interstitial opacity. No pleural effusion or pneumothorax. No acute osseous findings. IMPRESSION: Subtle right perihilar interstitial opacity could reflect mild edema or infiltrate. Electronically Signed   By: Davina Poke D.O.   On: 05/06/2019 10:25        Scheduled Meds: . atorvastatin  10 mg Oral QHS  . cephALEXin  500 mg Oral Q12H  . fluorometholone  1 drop Both Eyes  Daily  . insulin aspart  0-6 Units Subcutaneous TID WC  . lidocaine  1 patch Transdermal Q24H  . loratadine  10 mg Oral Daily  . losartan  50 mg Oral Daily  . metoprolol succinate  100 mg Oral Daily  . sodium chloride flush  3 mL Intravenous Q12H  . sodium chloride flush  3 mL Intravenous Q12H  . tamsulosin  0.4 mg Oral Daily   Continuous Infusions: . sodium chloride       LOS: 0 days    Time spent: 45 minutes    Sharen Hones, MD [facility]   To contact the attending provider between 7A-7P or the covering provider during after hours 7P-7A, please log into the web site [email or web address] and access using universal [place] password for that web site. If you do not have the password, please call the hospital operator.  05/07/2019, 9:05 AM

## 2019-05-07 NOTE — TOC Initial Note (Signed)
Transition of Care [facility]) - Initial/Assessment Note    Patient Details  Name: Matthew Goodwin MRN: [id number] Date of Birth: 15-Jun-1920  [facility] CM/SW Contact:    Candie Chroman, LCSW Phone Number: 05/07/2019, 9:14 AM  Clinical Narrative: Patient only oriented to self. Patient is a resident at [facility] at [address] in [place]. CSW called owner, Matthew Goodwin [phone]). She confirmed he is a resident there. Patient was not getting any home health services prior to admission and only used a cane to get around. Only known family member is a cousin named Matthew Goodwin [phone]) that is involved and is aware he is in the hospital. No further concerns. CSW encouraged Matthew Goodwin to contact CSW as needed. CSW will continue to follow patient for support and facilitate return to [facility] when stable for discharge.                 Expected Discharge Plan: Assisted Living Barriers to Discharge: Continued Medical Work up   Patient Goals and CMS Choice Patient states their goals for this hospitalization and ongoing recovery are:: Patient not fully oriented.   Choice offered to / list presented to : NA  Expected Discharge Plan and Services Expected Discharge Plan: Assisted Living     Post Acute Care Choice: NA Living arrangements for the past 2 months: [place]                                      Prior Living Arrangements/Services Living arrangements for the past 2 months: [place] Lives with:: Facility Resident Patient language and need for interpreter reviewed:: Yes Do you feel safe going back to the place where you live?: Yes      Need for Family Participation in Patient Care: Yes (Comment) Care giver support system in place?: Yes (comment) Current home services: DME Criminal Activity/Legal Involvement Pertinent to Current Situation/Hospitalization: No - Comment as needed  Activities of Daily Living Home  Assistive Devices/Equipment: Cane (specify quad or straight) ADL Screening (condition at time of admission) Patient's cognitive ability adequate to safely complete daily activities?: No Is the patient deaf or have difficulty hearing?: Yes Does the patient have difficulty seeing, even when wearing glasses/contacts?: No Does the patient have difficulty concentrating, remembering, or making decisions?: Yes Patient able to express need for assistance with ADLs?: Yes Does the patient have difficulty dressing or bathing?: No Independently performs ADLs?: Yes (appropriate for developmental age) Does the patient have difficulty walking or climbing stairs?: Yes Weakness of Legs: Both Weakness of Arms/Hands: Both  Permission Sought/Granted Permission sought to share information with : Facility Sport and exercise psychologist, Family Supports    Share Information with NAME: Matthew Goodwin  Permission granted to share info w AGENCY: [name]  Permission granted to share info w Relationship: Cousin  Permission granted to share info w Contact Information: [phone]  Emotional Assessment Appearance:: Appears stated age Attitude/Demeanor/Rapport: Unable to Assess Affect (typically observed): Unable to Assess Orientation: : Oriented to Self Alcohol / Substance Use: Not Applicable Psych Involvement: No (comment)  Admission diagnosis:  Back pain [M54.9] Lower back pain [M54.5] Acute cystitis without hematuria [N30.00] Closed fracture of first lumbar vertebra, unspecified fracture morphology, initial encounter [facility]) [S32.019A] Patient Active Problem List   Diagnosis Date Noted  . Back pain 05/06/2019  . Fall at home, initial encounter 05/06/2019  . Closed compression fracture  of body of L1 vertebra (HCC) 05/06/2019  . Hydroureteronephrosis 05/06/2019  . Acute lower UTI 05/06/2019  . DM type 2 (diabetes mellitus, type 2) (HCC) 05/06/2019  . Aortic stenosis 05/06/2019  . Benign essential HTN  05/06/2019   PCP:  Sherrie Mustache, MD Pharmacy:   [facility] - [place], [place] - [phone] [address] [place] [address] Phone: [phone] Fax: [phone]     Social Determinants of Health (SDOH) Interventions    Readmission Risk Interventions No flowsheet data found.

## 2019-05-07 NOTE — Consult Note (Signed)
Referring Physician:  No referring provider defined for this encounter.  Primary Physician:  Sherrie Mustache, MD  Chief Complaint:  L1 fracture  History of Present Illness: 05/07/2019 Matthew Goodwin is a 86 y.o. male who presents with the chief complaint of unwitnessed fall that resulted in L1 compression fracture.  He complains of back pain but not of weakness or radicular pain.  Of note, he lives in a group home.  He is unable to provide a full history.  Review of Systems:  A 10 point review of systems is negative, except for the pertinent positives and negatives detailed in the HPI.  Past Medical History: Past Medical History:  Diagnosis Date  . Anemia   . BPH (benign prostatic hyperplasia)   . Diabetes mellitus without complication (HCC)   . Edema   . Hypertension   . Hypoglycemia   . Renal insufficiency   . Urinary retention     Past Surgical History: Past Surgical History:  Procedure Laterality Date  . unknown      Allergies: Allergies as of 05/06/2019  . (No Known Allergies)    Medications:  Current Facility-Administered Medications:  .  0.9 %  sodium chloride infusion, 250 mL, Intravenous, PRN, Standley Brooking, MD .  acetaminophen (TYLENOL) tablet 650 mg, 650 mg, Oral, Q6H PRN **OR** acetaminophen (TYLENOL) suppository 650 mg, 650 mg, Rectal, Q6H PRN, Standley Brooking, MD .  atorvastatin (LIPITOR) tablet 10 mg, 10 mg, Oral, QHS, Standley Brooking, MD, 10 mg at 05/06/19 2105 .  cephALEXin (KEFLEX) capsule 500 mg, 500 mg, Oral, Q12H, Standley Brooking, MD, 500 mg at 05/06/19 2105 .  fluorometholone (FML) 0.1 % ophthalmic suspension 1 drop, 1 drop, Both Eyes, Daily, Standley Brooking, MD .  hydrALAZINE (APRESOLINE) tablet 10 mg, 10 mg, Oral, Q8H PRN, Standley Brooking, MD, 10 mg at 05/06/19 1852 .  insulin aspart (novoLOG) injection 0-6 Units, 0-6 Units, Subcutaneous, TID WC, Standley Brooking, MD .  lidocaine (LIDODERM) 5 % 1 patch, 1 patch,  Transdermal, Q24H, Standley Brooking, MD, 1 patch at 05/06/19 1416 .  loratadine (CLARITIN) tablet 10 mg, 10 mg, Oral, Daily, Tressie Ellis, RPH .  losartan (COZAAR) tablet 50 mg, 50 mg, Oral, Daily, Standley Brooking, MD .  metoprolol succinate (TOPROL-XL) 24 hr tablet 100 mg, 100 mg, Oral, Daily, Standley Brooking, MD .  morphine 2 MG/ML injection 2 mg, 2 mg, Intravenous, Q2H PRN, Standley Brooking, MD .  ondansetron (ZOFRAN) tablet 4 mg, 4 mg, Oral, Q6H PRN **OR** ondansetron (ZOFRAN) injection 4 mg, 4 mg, Intravenous, Q6H PRN, Standley Brooking, MD .  oxyCODONE (Oxy IR/ROXICODONE) immediate release tablet 5 mg, 5 mg, Oral, Q4H PRN, Standley Brooking, MD .  pneumococcal 23 valent vaccine (PNEUMOVAX-23) injection 0.5 mL, 0.5 mL, Intramuscular, Tomorrow-1000, Standley Brooking, MD .  sodium chloride flush (NS) 0.9 % injection 3 mL, 3 mL, Intravenous, Q12H, Standley Brooking, MD, 3 mL at 05/06/19 2106 .  sodium chloride flush (NS) 0.9 % injection 3 mL, 3 mL, Intravenous, Q12H, Standley Brooking, MD, 3 mL at 05/06/19 2106 .  sodium chloride flush (NS) 0.9 % injection 3 mL, 3 mL, Intravenous, PRN, Standley Brooking, MD .  tamsulosin [facility]) capsule 0.4 mg, 0.4 mg, Oral, Daily, Standley Brooking, MD   Social History: Social History   Tobacco Use  . Smoking status: Never Smoker  . Smokeless tobacco: Never Used  Substance Use Topics  . Alcohol  use: Not Currently  . Drug use: Not on file    Family Medical History: Family History  Family history unknown: Yes    Physical Examination: Vitals:   05/07/19 0428 05/07/19 0747  BP: 140/87 (!) 163/87  Pulse: 61 76  Resp: 20 16  Temp: 98.9 F (37.2 C)   SpO2: 99% 98%     General: Patient is well developed, well nourished, calm, collected, and in no apparent distress.  Psychiatric: Patient is non-anxious.  Head:  Pupils equal, round, and reactive to light.  ENT:  Oral mucosa appears well hydrated.  Neck:   Supple.   Full range of motion.  Respiratory: Patient is breathing without any difficulty.  Extremities: No edema.  Vascular: Palpable pulses in dorsal pedal vessels.  Skin:   On exposed skin, there are no abnormal skin lesions.  NEUROLOGICAL:  General: In no acute distress.   Awake, alert, oriented to person, place, but not to time.  Pupils equal round and reactive to light.  Facial tone is symmetric.  Tongue protrusion is midline.  Uncooperative with drift.  ROM of spine: in brace.  Palpation of spine: he is tender in L spine.    Strength:  He does not fully cooperate with upper extremity muscle groups, but is symmetric and at least 4+/5 in BUE and BLE.   Reflexes are 1+ and symmetric at the biceps, triceps, brachioradialis, patella and achilles.   Bilateral upper and lower extremity sensation is intact to light touch and pin prick.  Clonus is not present.  Toes are down-going.  Gait is untested. Hoffman's is absent.  Imaging: CT L spine 05/06/2019 IMPRESSION: 1. Comminuted L1 superior endplate compression fracture with 20% loss of height and minimal retropulsion of bone not resulting in stenosis. Vacuum phenomena in the T12-L1 disc may also be posttraumatic.  2. No other acute osseous abnormality in the lumbar spine. Age appropriate background lumbar spine degeneration.  3. CT Abdomen and Pelvis today reported separately.   Electronically Signed   By: Genevie Ann M.D.   On: 05/06/2019 13:03  I have personally reviewed the images and agree with the above interpretation.  Labs: CBC Latest Ref Rng & Units 05/06/2019 02/13/2018  WBC 4.0 - 10.5 K/uL 6.3 3.4(L)  Hemoglobin 13.0 - 17.0 g/dL 13.2 12.9(L)  Hematocrit 39.0 - 52.0 % 39.6 40.1  Platelets 150 - 400 K/uL 138(L) 158    Assessment and Plan: Matthew Goodwin is a pleasant 86 y.o. male with L1 compression fracture.  This should heal in a brace.   Continue brace.  If he is still having trouble bearing weight after several days in  hospital, please contact me and we will consider kyphoplasty with assistance of Dr. Rudene Christians.    For now, would continue brace, pain control, and PTOT for placement.  We will plan to see back in clinic in 2 weeks.     Matthew Goodwin K. Matthew Ribas MD, [facility]

## 2019-05-08 LAB — CBC WITH DIFFERENTIAL/PLATELET
Abs Immature Granulocytes: 0.01 10*3/uL (ref 0.00–0.07)
Basophils Absolute: 0 10*3/uL (ref 0.0–0.1)
Basophils Relative: 0 %
Eosinophils Absolute: 0.1 10*3/uL (ref 0.0–0.5)
Eosinophils Relative: 1 %
HCT: 38.5 % — ABNORMAL LOW (ref 39.0–52.0)
Hemoglobin: 12.7 g/dL — ABNORMAL LOW (ref 13.0–17.0)
Immature Granulocytes: 0 %
Lymphocytes Relative: 33 %
Lymphs Abs: 2 10*3/uL (ref 0.7–4.0)
MCH: 30.5 pg (ref 26.0–34.0)
MCHC: 33 g/dL (ref 30.0–36.0)
MCV: 92.5 fL (ref 80.0–100.0)
Monocytes Absolute: 0.6 10*3/uL (ref 0.1–1.0)
Monocytes Relative: 9 %
Neutro Abs: 3.3 10*3/uL (ref 1.7–7.7)
Neutrophils Relative %: 57 %
Platelets: 157 10*3/uL (ref 150–400)
RBC: 4.16 MIL/uL — ABNORMAL LOW (ref 4.22–5.81)
RDW: 12.5 % (ref 11.5–15.5)
WBC: 5.9 10*3/uL (ref 4.0–10.5)
nRBC: 0 % (ref 0.0–0.2)

## 2019-05-08 LAB — BASIC METABOLIC PANEL
Anion gap: 7 (ref 5–15)
BUN: 23 mg/dL (ref 8–23)
CO2: 26 mmol/L (ref 22–32)
Calcium: 9.2 mg/dL (ref 8.9–10.3)
Chloride: 107 mmol/L (ref 98–111)
Creatinine, Ser: 1.21 mg/dL (ref 0.61–1.24)
GFR calc Af Amer: 58 mL/min — ABNORMAL LOW (ref >60)
GFR calc non Af Amer: 50 mL/min — ABNORMAL LOW (ref >60)
Glucose, Bld: 90 mg/dL (ref 70–99)
Potassium: 3.8 mmol/L (ref 3.5–5.1)
Sodium: 140 mmol/L (ref 135–145)

## 2019-05-08 LAB — URINE CULTURE

## 2019-05-08 LAB — MAGNESIUM: Magnesium: 2.1 mg/dL (ref 1.7–2.4)

## 2019-05-08 LAB — GLUCOSE, CAPILLARY
Glucose-Capillary: 87 mg/dL (ref 70–99)
Glucose-Capillary: 108 mg/dL — ABNORMAL HIGH (ref 70–99)

## 2019-05-08 MED ORDER — LIDOCAINE 5 % EX PTCH: 1.0000 | MEDICATED_PATCH | CUTANEOUS | 0 refills | Status: DC

## 2019-05-08 MED ORDER — CEPHALEXIN 500 MG PO CAPS: 500.0000 mg | ORAL_CAPSULE | Freq: Two times a day (BID) | ORAL | 0 refills | Status: AC

## 2019-05-08 MED ORDER — OXYCODONE HCL 5 MG PO TABS: 5.0000 mg | ORAL_TABLET | Freq: Three times a day (TID) | ORAL | 0 refills | Status: DC | PRN

## 2019-05-08 NOTE — Progress Notes (Signed)
05/08/2019 1500  Stormy Fabian to be D/C'd assisted living per MD order.  Discussed prescriptions and follow up appointments with the patient. Prescriptions given to patient, medication list explained in detail. Pt verbalized understanding.  Allergies as of 05/08/2019   No Known Allergies     Medication List    TAKE these medications   acetaminophen 325 MG tablet Commonly known as: TYLENOL Take 650 mg by mouth every 6 (six) hours as needed for mild pain or moderate pain. Notes to patient: As needed   atorvastatin 10 MG tablet Commonly known as: LIPITOR Take 10 mg by mouth at bedtime. Notes to patient: Before bed 05/08/19   cephALEXin 500 MG capsule Commonly known as: KEFLEX Take 1 capsule (500 mg total) by mouth every 12 (twelve) hours for 5 days. Notes to patient: Before bed 05/08/19   docusate sodium 100 MG capsule Commonly known as: COLACE Take 100 mg by mouth daily. Notes to patient: Morning 05/09/19   ferrous sulfate 325 (65 FE) MG tablet Take 325 mg by mouth daily. Notes to patient: Morning 05/09/19   Fish Oil 1000 MG Caps Take 1,000 mg by mouth 2 (two) times daily. Notes to patient: Before bed 05/08/19   fluorometholone 0.1 % ophthalmic suspension Commonly known as: FML Place 1 drop into both eyes daily. Notes to patient: Morning 05/09/19   levocetirizine 5 MG tablet Commonly known as: XYZAL Take 5 mg by mouth daily. Notes to patient: Morning 05/09/19   lidocaine 5 % Commonly known as: LIDODERM Place 1 patch onto the skin daily. Remove & Discard patch within 12 hours or as directed by MD Notes to patient: Afternoon 05/08/19   losartan 50 MG tablet Commonly known as: COZAAR Take 50 mg by mouth daily. Notes to patient: Morning 05/09/19   metoprolol succinate 100 MG 24 hr tablet Commonly known as: TOPROL-XL Take 100 mg by mouth daily. Notes to patient: Morning 05/09/19   oxyCODONE 5 MG immediate release tablet Commonly known as: Oxy IR/ROXICODONE Take 1 tablet (5 mg  total) by mouth every 8 (eight) hours as needed for moderate pain. Notes to patient: As needed   tamsulosin 0.4 MG Caps capsule Commonly known as: FLOMAX Take 0.4 mg by mouth daily. Notes to patient: Morning 05/09/19   Vitamin D (Ergocalciferol) 1.25 MG (50000 UNIT) Caps capsule Commonly known as: DRISDOL Take 50,000 Units by mouth every Wednesday. Notes to patient: Morning 05/14/19       Vitals:   05/08/19 0636 05/08/19 1225  BP: (!) 141/85 132/74  Pulse: 65 61  Resp: 20 14  Temp: 98.2 F (36.8 C) 97.9 F (36.6 C)  SpO2: 100% 100%    Skin clean, dry and intact without evidence of skin break down, no evidence of skin tears noted. IV catheter discontinued intact. Site without signs and symptoms of complications. Dressing and pressure applied. Pt denies pain at this time. No complaints noted.  An After Visit Summary was printed and given to EMS to take with patient to assisted living facility. Patient escorted and D/C via EMS Bradly Chris

## 2019-05-08 NOTE — TOC Transition Note (Signed)
Transition of Care [facility]) - CM/SW Discharge Note   Patient Details  Name: Dymond Spreen MRN: [id number] Date of Birth: 01-Apr-1920  Transition of Care [facility]) CM/SW Contact:  Margarito Liner, LCSW Phone Number: 05/08/2019, 2:21 PM   Clinical Narrative: Patient has orders to discharge home today. [facility] is aware. ALF will order a bedside commode for use at the facility. Nurse does not need to call report per ALF owner. EMS has been called and patient is second on the list. No further concerns. CSW signing off.    Final next level of care: Assisted Living(with home health) Barriers to Discharge: Barriers Resolved   Patient Goals and CMS Choice Patient states their goals for this hospitalization and ongoing recovery are:: Patient not fully oriented.   Choice offered to / list presented to : [facility])  Discharge Placement                Patient to be transferred to facility by: EMS Name of family member notified: Lizabeth Leyden Patient and family notified of of transfer: 05/08/19  Discharge Plan and Services     Post Acute Care Choice: NA                    HH Arranged: PT, OT HH Agency: [facility] ([facility] Date HH Agency Contacted: 05/08/19   Representative spoke with at [facility] Agency: Mellissa Kohut  Social Determinants of Health (SDOH) Interventions     Readmission Risk Interventions No flowsheet data found.

## 2019-05-08 NOTE — TOC Progression Note (Addendum)
Transition of Care [facility]) - Progression Note    Patient Details  Name: Gurkaran Rahm MRN: [id number] Date of Birth: 06-Aug-1920  [facility]) CM/SW Contact  Margarito Liner, LCSW Phone Number: 05/08/2019, 9:16 AM  Clinical Narrative:  CSW called ALF owner, Leanne Chang, to discuss SNF recommendation. Gave summary of therapy notes but she feels they can meet his needs at the ALF with home health PT and OT. She requested services through [facility] as they primarily use them. Sent referral to [facility] representative. Awaiting decision. Left voicemail for patient's cousin. Patient will need to return to the ALF by EMS today.    9:51 am: Faxed discharge paperwork to [facility].  10:49 am: Received call back from Ms. Neale Burly, patient's cousin. She stated she is not patient's only family. He also has a son and nieces and nephews but she is the person that communicates between the hospital/ALF and the family. Made her aware of discharge plan and she will relay that information.  12:00 pm: Leanne Chang has not reviewed the discharge paperwork yet. She will review shortly and let CSW know if anything needs to be changed or if everything looks okay.  Expected Discharge Plan: Assisted Living Barriers to Discharge: Continued Medical Work up  Expected Discharge Plan and Services Expected Discharge Plan: Assisted Living     Post Acute Care Choice: NA Living arrangements for the past 2 months: Assisted Living Facility Expected Discharge Date: 05/08/19                                     Social Determinants of Health (SDOH) Interventions    Readmission Risk Interventions No flowsheet data found.

## 2019-05-08 NOTE — NC FL2 (Signed)
[place] LEVEL OF CARE SCREENING TOOL     IDENTIFICATION  Patient Name: Matthew Goodwin Birthdate: Sep 29, 1920 Sex: male Admission Date (Current Location): 05/06/2019  [facility] and [place] Number:  Engineering geologist and Address:  [facility], [address], [place], [place] [address]      Provider Number: [id number]  Attending Physician Name and Address:  Sharen Hones, MD  Relative Name and Phone Number:       Current Level of Care: Hospital Recommended Level of Care: Assisted Living Facility(with PT and OT through [facility]) Prior Approval Number:    Date Approved/Denied:   PASRR Number:    Discharge Plan: Other (Comment)(ALF with PT and OT through [facility])    Current Diagnoses: Patient Active Problem List   Diagnosis Date Noted  . Closed compression fracture of L2 vertebra ([place]) 05/07/2019  . Back pain 05/06/2019  . Fall at home, initial encounter 05/06/2019  . Closed compression fracture of body of L1 vertebra ([place]) 05/06/2019  . Hydroureteronephrosis 05/06/2019  . Acute lower UTI 05/06/2019  . DM type 2 (diabetes mellitus, type 2) ([place]) 05/06/2019  . Aortic stenosis 05/06/2019  . Benign essential HTN 05/06/2019    Orientation RESPIRATION BLADDER Height & Weight     Self  Normal Incontinent Weight: 190 lb (86.2 kg) Height:  6\' 2"  (188 cm)  BEHAVIORAL SYMPTOMS/MOOD NEUROLOGICAL BOWEL NUTRITION STATUS  (None) (None) Continent Diet(Low sodium, heart healthy)  AMBULATORY STATUS COMMUNICATION OF NEEDS Skin   Extensive Assist Verbally Skin abrasions                       Personal Care Assistance Level of Assistance  Bathing, Feeding, Dressing Bathing Assistance: Limited assistance Feeding assistance: Limited assistance Dressing Assistance: Limited assistance     Functional Limitations Info  Sight, Hearing, Speech Sight Info: Adequate Hearing Info: Adequate Speech Info: Adequate    SPECIAL CARE FACTORS FREQUENCY   PT (By licensed PT), OT (By licensed OT)     PT Frequency: 3 x week OT Frequency: 3 x week            Contractures Contractures Info: Not present    Additional Factors Info  Code Status, Allergies Code Status Info: Full code Allergies Info: NKDA           Current Medications (05/08/2019):  This is the current hospital active medication list Current Facility-Administered Medications  Medication Dose Route Frequency Provider Last Rate Last Admin  . 0.9 %  sodium chloride infusion  250 mL Intravenous PRN Samuella Cota, MD      . acetaminophen (TYLENOL) tablet 650 mg  650 mg Oral Q6H PRN Samuella Cota, MD       Or  . acetaminophen (TYLENOL) suppository 650 mg  650 mg Rectal Q6H PRN Samuella Cota, MD      . atorvastatin (LIPITOR) tablet 10 mg  10 mg Oral QHS Samuella Cota, MD   10 mg at 05/07/19 2121  . cephALEXin (KEFLEX) capsule 500 mg  500 mg Oral Q12H Samuella Cota, MD   500 mg at 05/08/19 0934  . fluorometholone (FML) 0.1 % ophthalmic suspension 1 drop  1 drop Both Eyes Daily Samuella Cota, MD   1 drop at 05/08/19 0934  . hydrALAZINE (APRESOLINE) tablet 10 mg  10 mg Oral Q8H PRN Samuella Cota, MD   10 mg at 05/06/19 1852  . insulin aspart (novoLOG) injection 0-6 Units  0-6 Units  Subcutaneous TID WC Standley Brooking, MD      . lidocaine (LIDODERM) 5 % 1 patch  1 patch Transdermal Q24H Standley Brooking, MD   1 patch at 05/07/19 1607  . loratadine (CLARITIN) tablet 10 mg  10 mg Oral Daily Tressie Ellis, RPH   10 mg at 05/08/19 0934  . losartan (COZAAR) tablet 50 mg  50 mg Oral Daily Standley Brooking, MD   50 mg at 05/08/19 0934  . metoprolol succinate (TOPROL-XL) 24 hr tablet 100 mg  100 mg Oral Daily Standley Brooking, MD   100 mg at 05/08/19 0933  . morphine 2 MG/ML injection 2 mg  2 mg Intravenous Q2H PRN Standley Brooking, MD   2 mg at 05/07/19 1608  . ondansetron (ZOFRAN) tablet 4 mg  4 mg Oral Q6H PRN Standley Brooking, MD        Or  . ondansetron [facility]) injection 4 mg  4 mg Intravenous Q6H PRN Standley Brooking, MD      . oxyCODONE (Oxy IR/ROXICODONE) immediate release tablet 5 mg  5 mg Oral Q4H PRN Standley Brooking, MD   5 mg at 05/08/19 7782  . sodium chloride flush (NS) 0.9 % injection 3 mL  3 mL Intravenous Q12H Standley Brooking, MD   3 mL at 05/06/19 2106  . tamsulosin (FLOMAX) capsule 0.4 mg  0.4 mg Oral Daily Standley Brooking, MD   0.4 mg at 05/08/19 4235     Discharge Medications: TAKE these medications   acetaminophen 325 MG tablet Commonly known as: TYLENOL Take 650 mg by mouth every 6 (six) hours as needed for mild pain or moderate pain.   atorvastatin 10 MG tablet Commonly known as: LIPITOR Take 10 mg by mouth at bedtime.   cephALEXin 500 MG capsule Commonly known as: KEFLEX Take 1 capsule (500 mg total) by mouth every 12 (twelve) hours for 5 days.   docusate sodium 100 MG capsule Commonly known as: COLACE Take 100 mg by mouth daily.   ferrous sulfate 325 (65 FE) MG tablet Take 325 mg by mouth daily.   Fish Oil 1000 MG Caps Take 1,000 mg by mouth 2 (two) times daily.   fluorometholone 0.1 % ophthalmic suspension Commonly known as: FML Place 1 drop into both eyes daily.   levocetirizine 5 MG tablet Commonly known as: XYZAL Take 5 mg by mouth daily.   lidocaine 5 % Commonly known as: LIDODERM Place 1 patch onto the skin daily. Remove & Discard patch within 12 hours or as directed by MD   losartan 50 MG tablet Commonly known as: COZAAR Take 50 mg by mouth daily.   metoprolol succinate 100 MG 24 hr tablet Commonly known as: TOPROL-XL Take 100 mg by mouth daily.   oxyCODONE 5 MG immediate release tablet Commonly known as: Oxy IR/ROXICODONE Take 1 tablet (5 mg total) by mouth every 8 (eight) hours as needed for moderate pain.   tamsulosin 0.4 MG Caps capsule Commonly known as: FLOMAX Take 0.4 mg by mouth daily.   Vitamin D (Ergocalciferol) 1.25 MG (50000  UNIT) Caps capsule Commonly known as: DRISDOL Take 50,000 Units by mouth every Wednesday.     Relevant Imaging Results:  Relevant Lab Results:   Additional Information SS#: [id number]  Margarito Liner, LCSW

## 2019-05-08 NOTE — Discharge Summary (Signed)
Physician Discharge Summary  Patient ID: Matthew Goodwin MRN: [id number] DOB/AGE: May 07, 1920 86 y.o.  Admit date: 05/06/2019 Discharge date: 05/08/2019  Admission Diagnoses:  Discharge Diagnoses:  Principal Problem:   Closed compression fracture of body of L1 vertebra (HCC) Active Problems:   Back pain   Fall at home, initial encounter   Hydroureteronephrosis   Acute lower UTI   DM type 2 (diabetes mellitus, type 2) (HCC)   Aortic stenosis   Benign essential HTN   Closed compression fracture of L2 vertebra (HCC)   Discharged Condition: good  Hospital Course:  Patient is a 86 year old male with history of type 2 diabetes, essential hypertension, benign prostate hydrophilia, type 2 diabetes who was admitted for back pain after a fall, CT scan showed L1 compression fracture. Patient is seen by neurosurgery, he is not a candidate for surgery.  Patient also had a urinary retention likely secondary to UTI.  He was given oral antibiotics.  Subsequent bladder scan did not show any additional urinary retention.  Urine culture came back with multiple bacteria, will continue oral antibiotics with Keflex. Condition so far had improved, he does not have a significant pain.  No fever or chills.  Medically stable to be discharged.  I have discussed with patient family, notified to transfer to the group home.  #1.  L1 compression fracture at superior endplate with 20% loss of height secondary to fall at home. Discussed with Dr.Yarbrough, he does not need surgery.  No concern for spinal stenosis. Patient has significant urinary retention, probably not from spinal stenosis.  He is not a surgery candidate if there is any spinal damage.   2.  Urinary retention with bilateral hydroureteronephrosis. Seen by urology.  Appear to be secondary to urinary tract infection.  Urine culture has multiple bacteria, he was treated with Keflex, condition had improved.  Bladder scan no longer has any urinary  retention.  3.  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 Glucose not a significant elevated.  4. Essential hypertension. Continue some home medicines.  5.  Chronic dementia.   6. Mild thrombocytopenia. Resolved.  7.  Acute kidney injury. Renal function back to baseline.   Consults: Urology and neurosurgery.  Significant Diagnostic Studies:  CT Lumbar  FINDINGS: Segmentation: Normal.  Alignment: Straightening of lumbar lordosis. Mild dextroconvex upper and levoconvex lower lumbar scoliosis. No spondylolisthesis.  Vertebrae: Lumbar levels aside from L1 appear intact. Visible sacrum and SI joints appear intact. Visible lower thoracic levels and posterior ribs appear intact.  Comminution of the L1 superior endplate extending into the central L1 body (sagittal image 22, coronal image 29) with mild loss of height (20%). The L1 pedicles and posterior elements appear intact; unfused right L1 transverse process ossification center suspected. Minimal retropulsion of bone.  Paraspinal and other soft tissues: Abdominal and pelvic viscera are reported separately today. Mild paraspinal soft tissue inflammation at L1.  Disc levels:  Mild vacuum disc at T12-L1 might be posttraumatic.  More degenerative appearing vacuum disc at L1-L2 and L3-L4, associated with-disc space loss at each level. Overall lumbar spine degeneration is fairly age-appropriate. Up to mild multifactorial degenerative lumbar spinal stenosis at L3-L4 and L4-L5.  IMPRESSION: 1. Comminuted L1 superior endplate compression fracture with 20% loss of height and minimal retropulsion of bone not resulting in stenosis. Vacuum phenomena in the T12-L1 disc may also be posttraumatic.  2. No other acute osseous abnormality in the lumbar spine. Age appropriate background lumbar spine degeneration.  3. CT Abdomen and Pelvis  today reported separately.   Electronically Signed   By: Genevie Ann M.D.   On: 05/06/2019 13:03  Treatments: antibiotics: Keflex.  Discharge Exam: Blood pressure (!) 141/85, pulse 65, temperature 98.2 F (36.8 C), temperature source Oral, resp. rate 20, height 6\' 2"  (1.88 m), weight 86.2 kg, SpO2 100 %. General appearance: alert and cooperative Resp: clear to auscultation bilaterally Cardio: regular rate and rhythm, S1, S2 normal, no murmur, click, rub or gallop GI: soft, non-tender; bowel sounds normal; no masses,  no organomegaly Extremities: extremities normal, atraumatic, no cyanosis or edema  Oriented to self  Disposition: Discharge disposition: 01-Home or Self Care       Discharge Instructions    Diet - low sodium heart healthy   Complete by: As directed    Increase activity slowly   Complete by: As directed      Allergies as of 05/08/2019   No Known Allergies     Medication List    TAKE these medications   acetaminophen 325 MG tablet Commonly known as: TYLENOL Take 650 mg by mouth every 6 (six) hours as needed for mild pain or moderate pain.   atorvastatin 10 MG tablet Commonly known as: LIPITOR Take 10 mg by mouth at bedtime.   cephALEXin 500 MG capsule Commonly known as: KEFLEX Take 1 capsule (500 mg total) by mouth every 12 (twelve) hours for 5 days.   docusate sodium 100 MG capsule Commonly known as: COLACE Take 100 mg by mouth daily.   ferrous sulfate 325 (65 FE) MG tablet Take 325 mg by mouth daily.   Fish Oil 1000 MG Caps Take 1,000 mg by mouth 2 (two) times daily.   fluorometholone 0.1 % ophthalmic suspension Commonly known as: FML Place 1 drop into both eyes daily.   levocetirizine 5 MG tablet Commonly known as: XYZAL Take 5 mg by mouth daily.   lidocaine 5 % Commonly known as: LIDODERM Place 1 patch onto the skin daily. Remove & Discard patch within 12 hours or as directed by MD   losartan 50 MG tablet Commonly known as: COZAAR Take 50 mg by mouth daily.   metoprolol succinate 100 MG 24 hr  tablet Commonly known as: TOPROL-XL Take 100 mg by mouth daily.   oxyCODONE 5 MG immediate release tablet Commonly known as: Oxy IR/ROXICODONE Take 1 tablet (5 mg total) by mouth every 8 (eight) hours as needed for moderate pain.   tamsulosin 0.4 MG Caps capsule Commonly known as: FLOMAX Take 0.4 mg by mouth daily.   Vitamin D (Ergocalciferol) 1.25 MG (50000 UNIT) Caps capsule Commonly known as: DRISDOL Take 50,000 Units by mouth every Wednesday.      Follow-up Information    Casilda Carls, MD Follow up in 1 week(s).   Specialty: Internal Medicine Contact information: [place] [address] [phone]         35 minutes  Signed: Sharen Hones 05/08/2019, 8:02 AM

## 2019-05-08 NOTE — Discharge Instructions (Signed)
Chronic Back Pain When back pain lasts longer than 3 months, it is called chronic back pain. Pain may get worse at certain times (flare-ups). There are things you can do at home to manage your pain. Follow these instructions at home: Activity      Avoid bending and other activities that make pain worse.  When standing: ? Keep your upper back and neck straight. ? Keep your shoulders pulled back. ? Avoid slouching.  When sitting: ? Keep your back straight. ? Relax your shoulders. Do not round your shoulders or pull them backward.  Do not sit or stand in one place for long periods of time.  Take short rest breaks during the day. Lying down or standing is usually better than sitting. Resting can help relieve pain.  When sitting or lying down for a long time, do some mild activity or stretching. This will help to prevent stiffness and pain.  Get regular exercise. Ask your doctor what activities are safe for you.  Do not lift anything that is heavier than 10 lb (4.5 kg). To prevent injury when you lift things: ? Bend your knees. ? Keep the weight close to your body. ? Avoid twisting. Managing pain  If told, put ice on the painful area. Your doctor may tell you to use ice for 24-48 hours after a flare-up starts. ? Put ice in a plastic bag. ? Place a towel between your skin and the bag. ? Leave the ice on for 20 minutes, 2-3 times a day.  If told, put heat on the painful area as often as told by your doctor. Use the heat source that your doctor recommends, such as a moist heat pack or a heating pad. ? Place a towel between your skin and the heat source. ? Leave the heat on for 20-30 minutes. ? Remove the heat if your skin turns bright red. This is especially important if you are unable to feel pain, heat, or cold. You may have a greater risk of getting burned.  Soak in a warm bath. This can help relieve pain.  Take over-the-counter and prescription medicines only as told by your  doctor. General instructions  Sleep on a firm mattress. Try lying on your side with your knees slightly bent. If you lie on your back, put a pillow under your knees.  Keep all follow-up visits as told by your doctor. This is important. Contact a doctor if:  You have pain that does not get better with rest or medicine. Get help right away if:  One or both of your arms or legs feel weak.  One or both of your arms or legs lose feeling (numbness).  You have trouble controlling when you poop (bowel movement) or pee (urinate).  You feel sick to your stomach (nauseous).  You throw up (vomit).  You have belly (abdominal) pain.  You have shortness of breath.  You pass out (faint). Summary  When back pain lasts longer than 3 months, it is called chronic back pain.  Pain may get worse at certain times (flare-ups).  Use ice and heat as told by your doctor. Your doctor may tell you to use ice after flare-ups. This information is not intended to replace advice given to you by your health care provider. Make sure you discuss any questions you have with your health care provider. Document Revised: 04/11/2018 Document Reviewed: 08/03/2016 Elsevier Patient Education  [employer].   Lumbar Spine Fracture A lumbar spine fracture is a  break in one of the bones of the lower back. Lumbar spine fractures can vary from mild to severe. The most severe types are those that:  Cause the broken bones to move out of place (unstable).  Injure or press on the spinal cord. During recovery, it is normal to have pain and stiffness in the lower back for weeks. What are the causes? This condition may be caused by:  A fall.  A car accident.  A gunshot wound.  A hard, direct hit to the back. What increases the risk? You are more likely to develop this condition if:  You are in a situation that could result in a fall or other violent injury.  You have a condition that causes weakness in the  bones (osteoporosis). What are the signs or symptoms? The main symptom of this condition is severe pain in the lower back. If a fracture is complex or severe, there may also be:  A misshapen or swollen area on the lower back.  Limited ability to move an area of the lower back.  Inability to empty the bladder (urinary retention).  Loss of bowel or bladder control (incontinence).  Loss of strength or sensation in the legs, feet, and toes.  Inability to move (paralysis). How is this diagnosed? This condition is diagnosed based on:  A physical exam.  Symptoms and what happened just before they developed.  The results of imaging tests, such as an X-ray, CT scan, or MRI. If your nerves have been damaged, you may also have other tests to find out the extent of the damage. How is this treated? Treatment for this condition depends on how severe the injury is. Most fractures can be treated with:  A back brace.  Bed rest and activity restrictions.  Pain medicine.  Physical therapy. Fractures that are complex, involve multiple bones, or make the spine unstable may require surgery. Surgery is done:  To remove pressure from the nerves or spinal cord.  To stabilize the broken pieces of bone. Follow these instructions at home: Medicines  Take over-the-counter and prescription medicines only as told by your health care provider.  Do not drive or use heavy machinery while taking prescription pain medicine.  If you are taking prescription pain medicine, take actions to prevent or treat constipation. Your health care provider may recommend that you: ? Drink enough fluid to keep your urine pale yellow. ? Eat foods that are high in fiber, such as fresh fruits and vegetables, whole grains, and beans. ? Limit foods that are high in fat and processed sugars, such as fried or sweet foods. ? Take an over-the-counter or prescription medicine for constipation. If you have a brace:  Wear the  back brace as told by your health care provider. Remove it only as told by your health care provider.  Keep the brace clean.  If the brace is not waterproof: ? Do not let it get wet. ? Cover it with a watertight covering when you take a bath or a shower. Activity  Stay in bed (on bed rest) only as directed by your health care provider.  Do exercises to improve motion and strength in your back (physical therapy), if your health care provider tells you to do so.  Return to your normal activities as directed by your health care provider. Ask your health care provider what activities are safe for you. Managing pain, stiffness, and swelling   If directed, put ice on the injured area: ? If you have  a removable brace, remove it as told by your health care provider. ? Put ice in a plastic bag. ? Place a towel between your skin and the bag. ? Leave the ice on for 20 minutes, 2-3 times a day. General instructions  Do not use any products that contain nicotine or tobacco, such as cigarettes and e-cigarettes. These can delay healing after injury. If you need help quitting, ask your health care provider.  Do not drink alcohol. Alcohol can interfere with your treatment.  Keep all follow-up visits as directed by your health care provider. This is important. ? Failing to follow up as recommended could result in permanent injury, disability, or long-lasting (chronic) pain. Contact a health care provider if:  You have a fever.  Your pain medicine is not helping.  Your pain does not get better over time.  You cannot return to your normal activities as planned or expected. Get help right away if:  You have difficulty breathing.  Your pain is very bad and it suddenly gets worse.  You have numbness, tingling, or weakness in any part of your body.  You are unable to empty your bladder.  You cannot control your bladder or bowels.  You are unable to move any body part (paralysis) that is  below the level of your injury.  You vomit.  You have pain in your abdomen. Summary  A lumbar spine fracture is a break in one of the bones of the lower back.  The main symptom of this condition is severe pain in the lower back. If a fracture is complex, there may also be numbness, tingling, or paralysis in the legs.  Treatment depends on how severe the injury is. Most fractures can be treated with a back brace, bed rest and activity restrictions, pain medicine, and physical therapy.  Fractures that are complex, involve multiple bones, or make the spine unstable may require surgery. This information is not intended to replace advice given to you by your health care provider. Make sure you discuss any questions you have with your health care provider. Document Revised: 02/03/2017 Document Reviewed: 02/03/2017 Elsevier Patient Education  2020 [employer]   Transverse Process Fracture  Bones of the spine (vertebrae) have portions that extend off to either side of the spine. These portions of bone are called transverse processes. A transverse process fracture, which is also called a rotation spine fracture, is a break in a transverse process. What are the causes? This condition may be caused by:  A fall from a great height.  A car accident.  A sports injury.  A gunshot wound.  A hard, direct hit to the back. This kind of fracture often results from a sudden and severe bending of the spine to one side. Depending on the cause of the fracture, one or more bones may be affected. What increases the risk? You are more likely to develop this condition if:  You have thinning and loss of density in the bones (osteoporosis).  You play a contact sport. What are the signs or symptoms? The main symptom of this condition is back pain. The pain may:  Be felt on the side of the spine (flank) where the fracture is.  Get worse when you move or take a deep breath. How is this  diagnosed? This condition may be diagnosed based on:  Your symptoms.  Your medical history.  A physical exam. You may also have other tests, including:  X-rays.  A CT scan.  MRI.  How is this treated? Most transverse process fractures heal on their own with time and rest. Treatment may involve supportive care, such as:  Limiting activity.  Medicines, such as: ? Pain medicine. ? Muscle-relaxing medicine.  Physical therapy.  A neck or back brace. Follow these instructions at home: If you have a brace:  Wear the neck or back brace as told by your health care provider. Remove it only as told by your health care provider.  Keep the brace clean.  If the brace is not waterproof: ? Do not let it get wet. ? Cover it with a watertight covering when you take a bath or a shower. Managing pain, stiffness, and swelling   If directed, put ice on the injured area: ? If you have a removable brace, remove it as told by your health care provider. ? Put ice in a plastic bag. ? Place a towel between your skin and the bag. ? Leave the ice on for 20 minutes, 2-3 times a day. Medicines  Take over-the-counter and prescription medicines only as told by your health care provider.  Do not drive or use heavy machinery while taking prescription pain medicine.  If you are taking prescription pain medicine, take actions to prevent or treat constipation. Your health care provider may recommend that you: ? Drink enough fluid to keep your urine pale yellow. ? Eat foods that are high in fiber, such as fresh fruits and vegetables, whole grains, and beans. ? Limit foods that are high in fat and processed sugars, such as fried or sweet foods. ? Take an over-the-counter or prescription medicine for constipation. Activity  Stay in bed (on bed rest) only as directed by your health care provider. ? Avoid being in bed for a long time without moving. Get up to take short walks every 1-2 hours. This is  important to improve blood flow and breathing. Ask for help if you feel weak or unsteady.  Return to your normal activities when your health care provider says it is okay. Ask if there are any activities that you should not do.  Do physical therapy exercises as recommended by your health care provider. General instructions  Do not use any products that contain nicotine or tobacco, such as cigarettes and e-cigarettes. These can delay bone healing. If you need help quitting, ask your health care provider.  Keep all follow-up visits as told by your health care provider. This is important. Visits can help to prevent permanent injury, disability, and long-lasting (chronic) pain. Contact a health care provider if:  You have a fever.  You develop a cough that makes your pain worse.  Your pain medicine is not helping.  Your pain does not get better over time.  You cannot return to your normal activities as planned or expected. Get help right away if:  Your pain is very bad and it suddenly gets worse.  You are unable to move any body part (paralysis) that is below the level of your injury.  You have numbness, tingling, or weakness in any body part that is below the level of your injury.  You cannot control your bladder or bowels. Summary  A transverse process fracture is a break in the portion of the bone that extends to the side of the spine.  Most transverse process fractures heal on their own with time and rest.  You may also have supportive treatments such as a back brace, pain medicines, and physical therapy.  Keep all follow-up visits. This  is important and will help to prevent permanent injury, disability, and long-lasting (chronic) pain. This information is not intended to replace advice given to you by your health care provider. Make sure you discuss any questions you have with your health care provider. Document Revised: 01/31/2017 Document Reviewed: 01/31/2017 Elsevier  Patient Education  2020 [employer].

## 2020-08-30 IMAGING — CT CT CERVICAL SPINE W/O CM
3 of 5 series · 12 of 33 positions shown, 14 images · non-contrast
Comparison: CT head 02/13/2018

CLINICAL DATA: Polytrauma, unwitnessed fall, patient states he
tripped and fell

EXAM:
CT HEAD WITHOUT CONTRAST
CT CERVICAL SPINE WITHOUT CONTRAST
TECHNIQUE: Multidetector CT imaging of the head and cervical spine was
performed following the standard protocol without intravenous
contrast. Multiplanar CT image reconstructions of the cervical spine
were also generated.

[Series 9: thins st · axial · 0.34mm/px · z∈[-260,-126]mm · 4 of 359 slices shown, 5 images]
[im 45/359  soft-tissue]
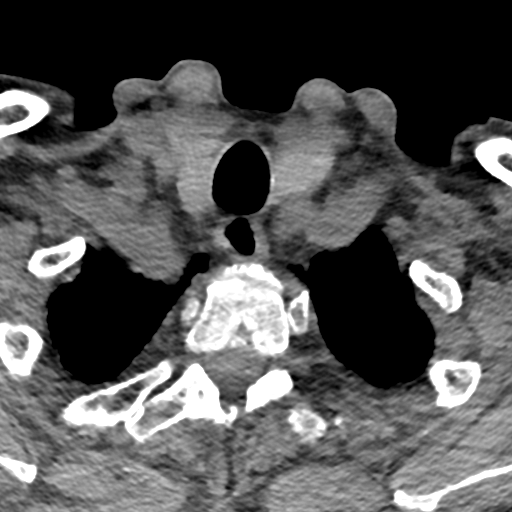
[im 45/359  bone]
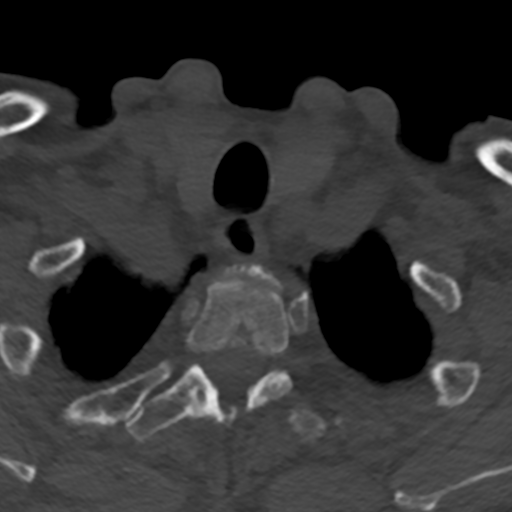
[im 135/359  bone]
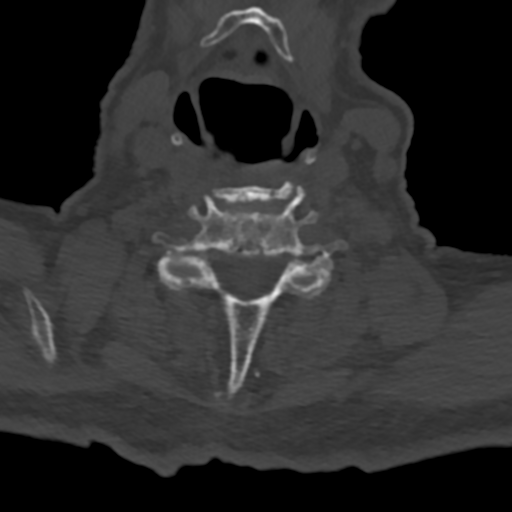
[im 224/359  bone]
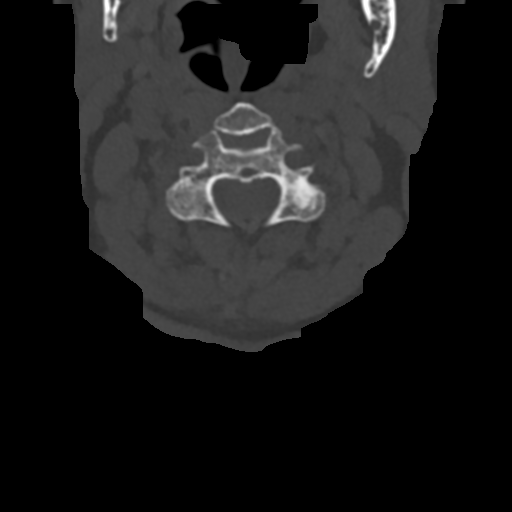
[im 314/359  bone]
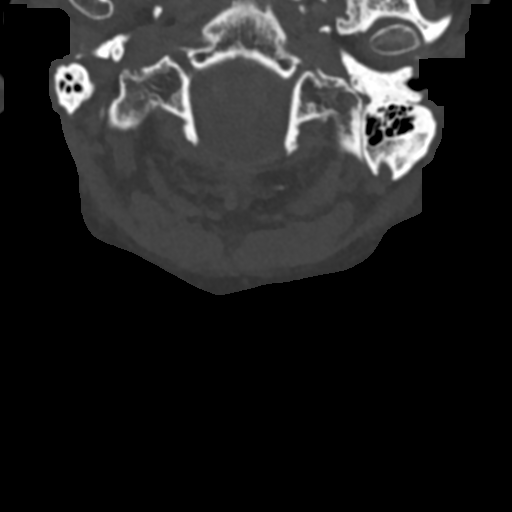

[Series 10: sagittal bone · sagittal · 0.26mm/px · 5 of 61 slices shown, 6 images]
[im 21/61  bone]
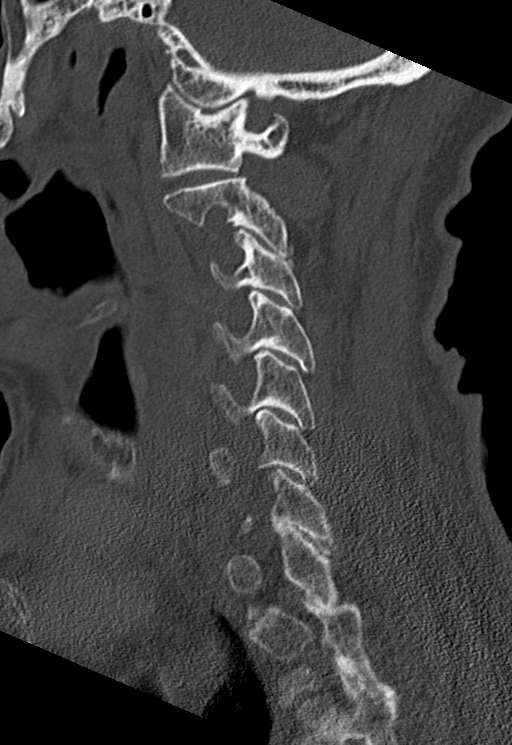
[im 26/61  bone]
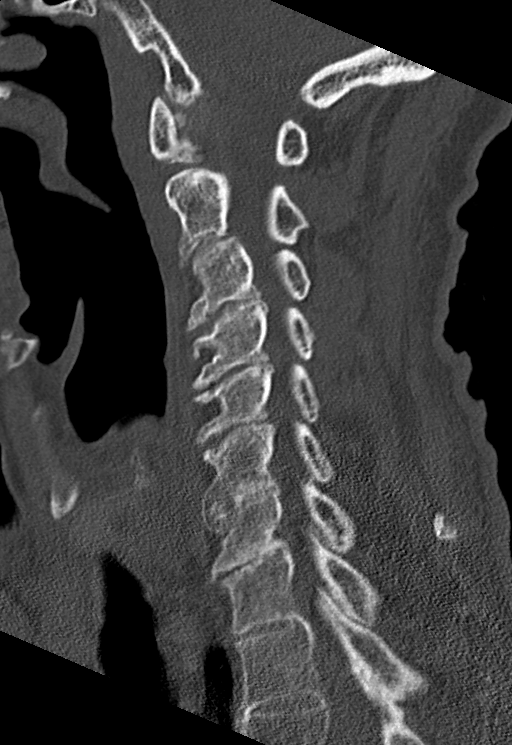
[im 31/61  soft-tissue]
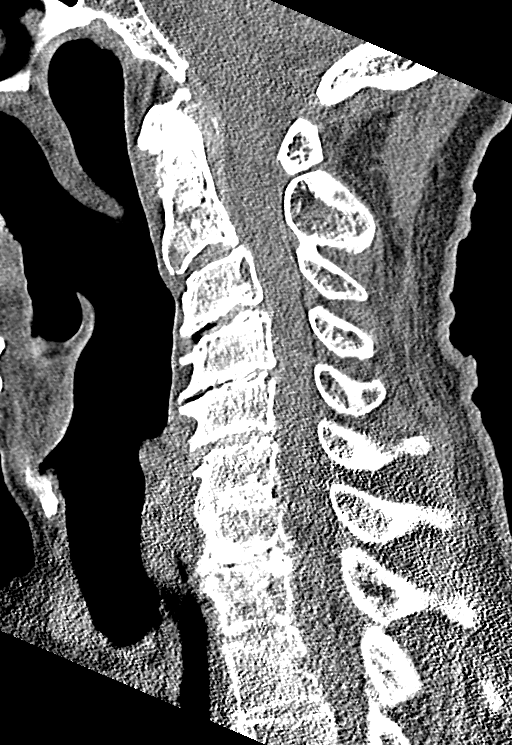
[im 31/61  bone]
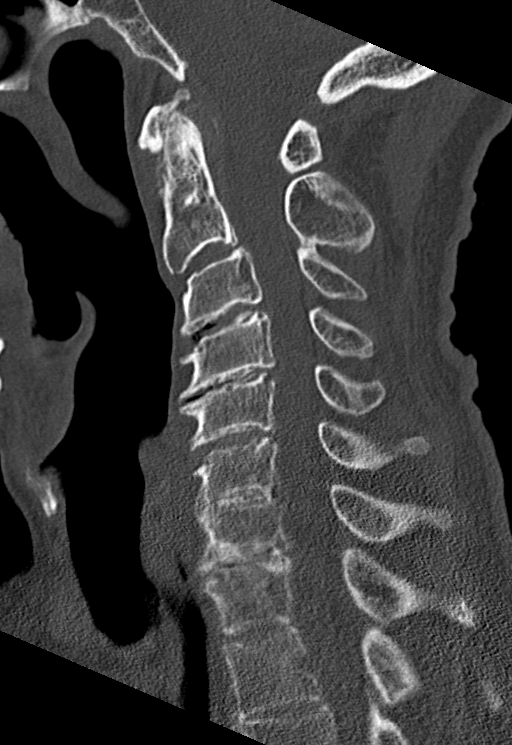
[im 36/61  bone]
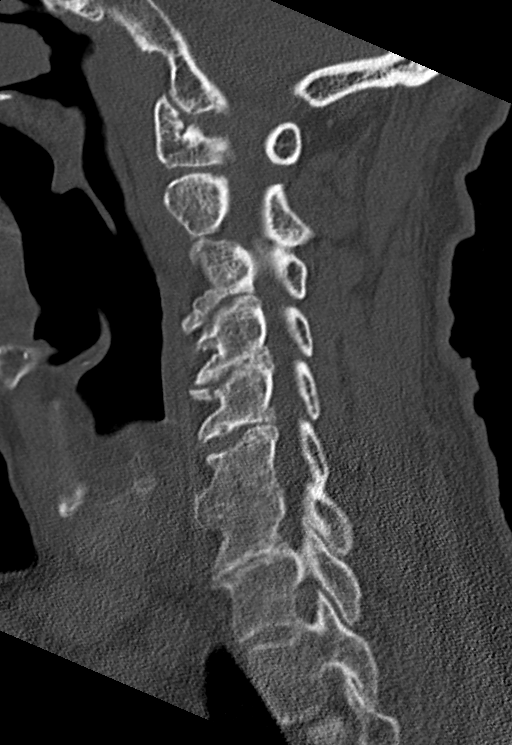
[im 41/61  bone]
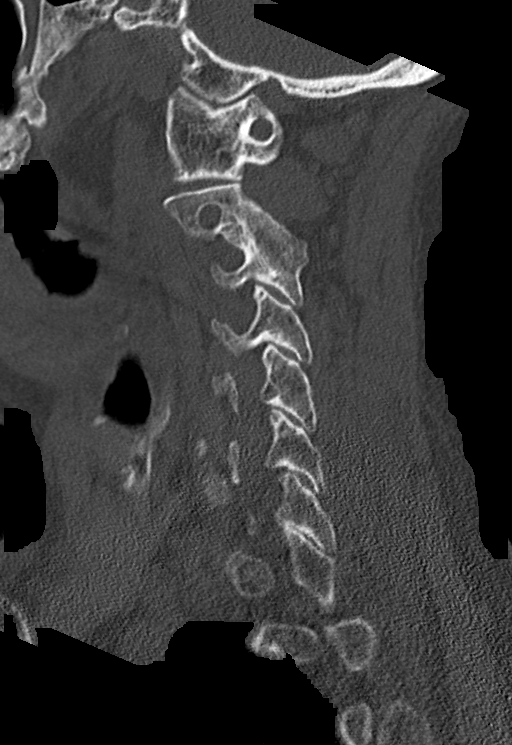

[Series 11: coronal bone · coronal · 0.23mm/px · 3 of 68 slices shown]
[im 15/68  bone]
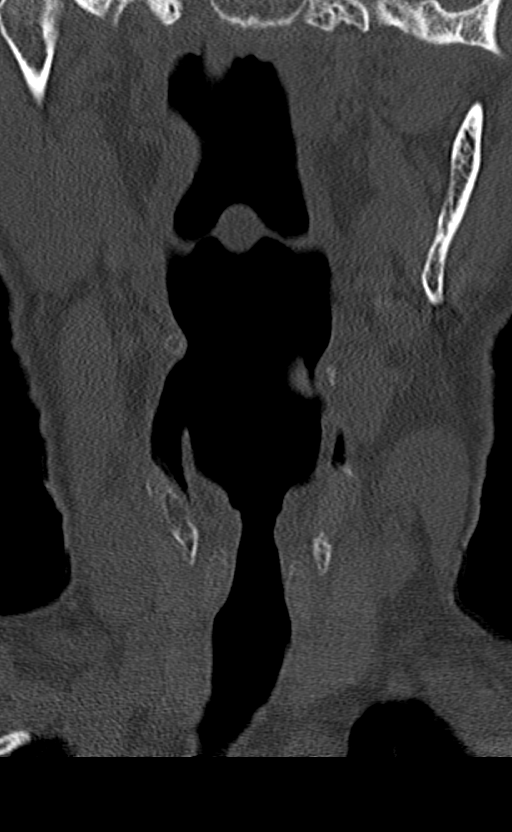
[im 28/68  bone]
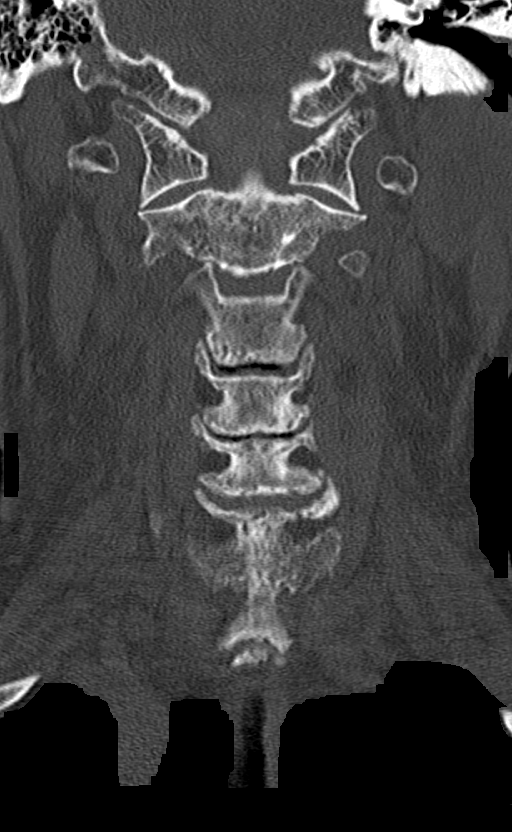
[im 40/68  bone]
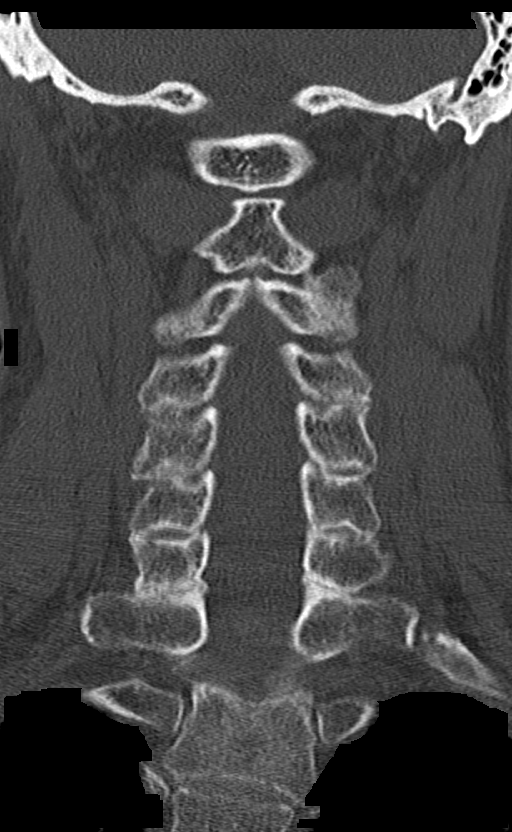

[12 of 33 positions shown; findings below may reference images not displayed]

FINDINGS: CT HEAD FINDINGS

Brain: Generalized atrophy. Normal ventricular morphology. No
midline shift or mass effect. Small vessel chronic ischemic changes
of deep cerebral white matter. Ossification of the anterior falx.
Old lateral LEFT cerebellar infarct. No intracranial hemorrhage,
mass lesion, evidence of acute infarction, or extra-axial fluid
collection.

Vascular: Minimal atherosclerotic calcifications at skull base

Skull: Intact

Sinuses/Orbits: Clear

Other: N/A

CT CERVICAL SPINE FINDINGS

Alignment: Normal

Skull base and vertebrae: Osseous mineralization normal. Skull base
intact. Multilevel disc space narrowing and endplate spur formation.
Minimal facet degenerative changes. Vertebral body heights
maintained. No fracture, subluxation or bone destruction.

Soft tissues and spinal canal: Prevertebral soft tissues normal
thickness

Disc levels:  No additional abnormalities

Upper chest: Lung apices clear

Other: N/A
IMPRESSION: Atrophy with small vessel chronic ischemic changes of deep cerebral
white matter.

Small old LEFT cerebellar infarct.

No acute intracranial abnormalities.

Degenerative disc disease changes cervical spine.

No acute cervical spine abnormalities.

## 2020-08-30 IMAGING — CT CT HEAD W/O CM
3 series · 14 of 45 positions shown, 16 images · non-contrast
Comparison: CT head 02/13/2018

CLINICAL DATA: Polytrauma, unwitnessed fall, patient states he
tripped and fell

EXAM:
CT HEAD WITHOUT CONTRAST
CT CERVICAL SPINE WITHOUT CONTRAST
TECHNIQUE: Multidetector CT imaging of the head and cervical spine was
performed following the standard protocol without intravenous
contrast. Multiplanar CT image reconstructions of the cervical spine
were also generated.

[Series 2: head wo · axial · 0.40mm/px · z∈[-109,+6]mm · 8 of 28 slices shown, 10 images]
[im 3/28  brain]
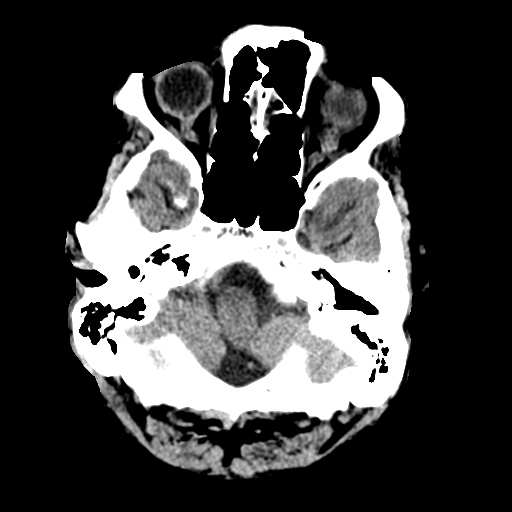
[im 3/28  bone]
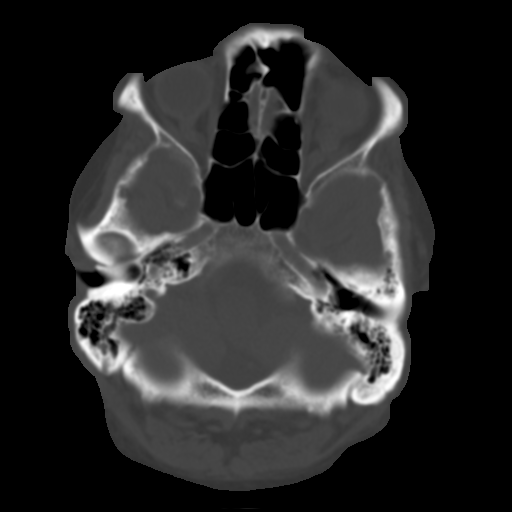
[im 6/28  brain]
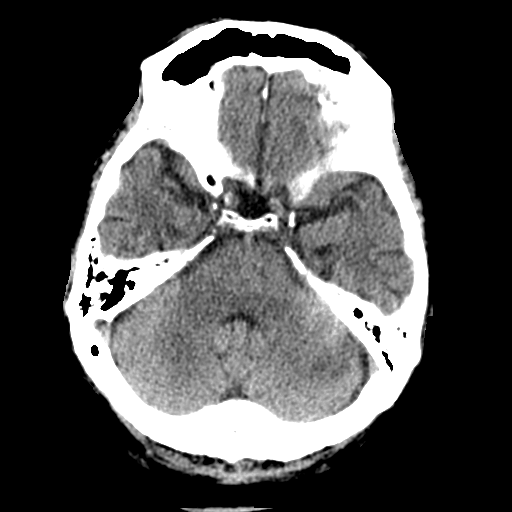
[im 10/28  brain]
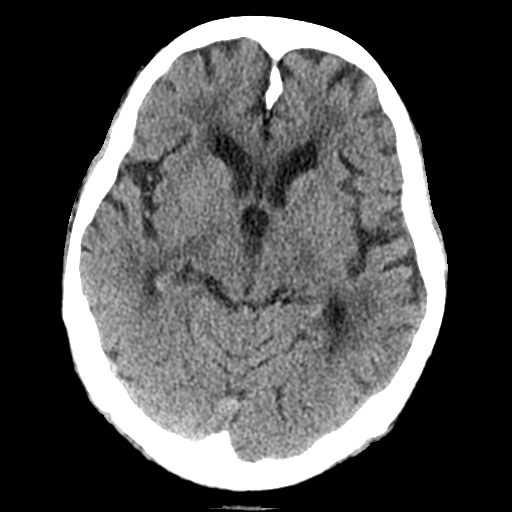
[im 13/28  brain]
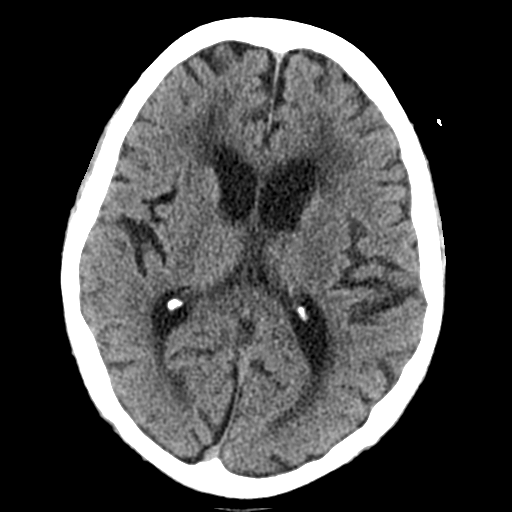
[im 16/28  brain]
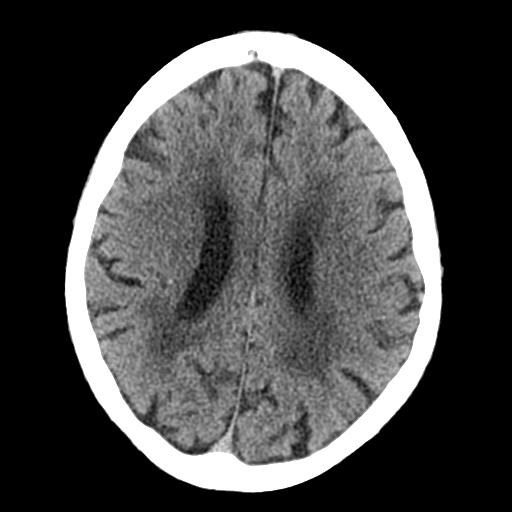
[im 16/28  bone]
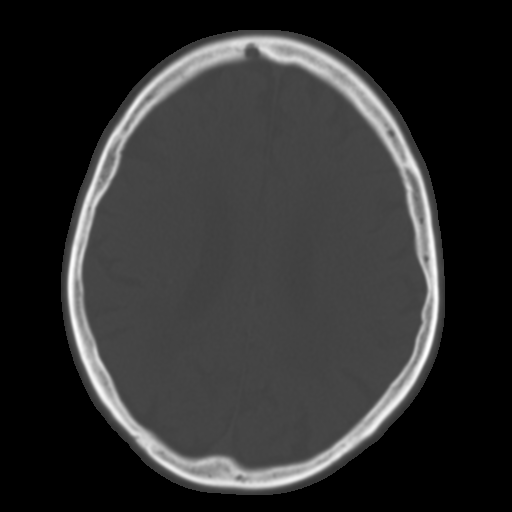
[im 19/28  brain]
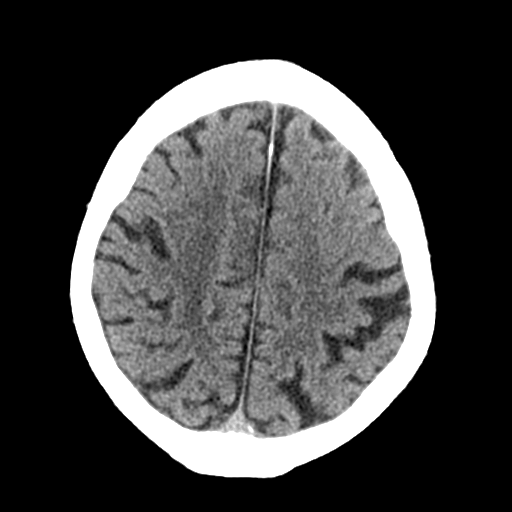
[im 23/28  brain]
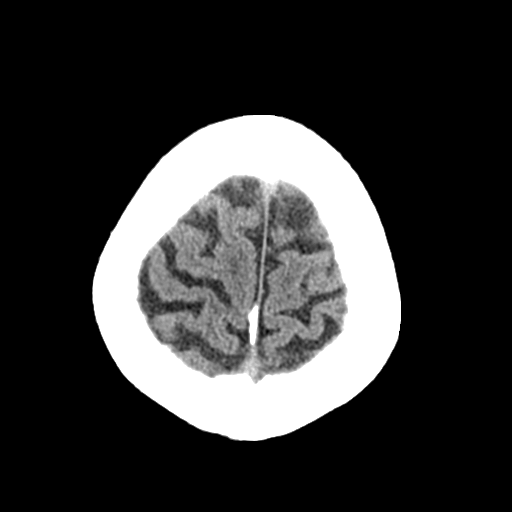
[im 26/28  brain]
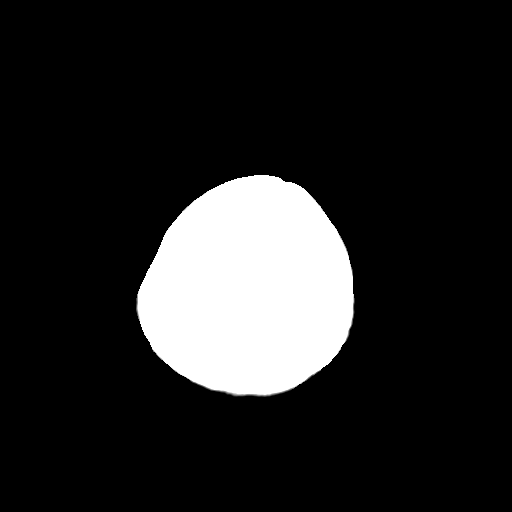

[Series 4: coronal soft tissue · coronal · 0.28mm/px · 3 of 68 slices shown]
[im 23/68  brain]
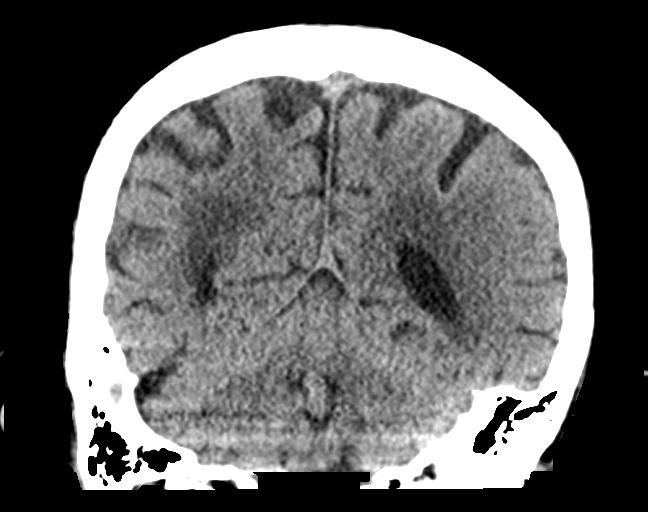
[im 30/68  brain]
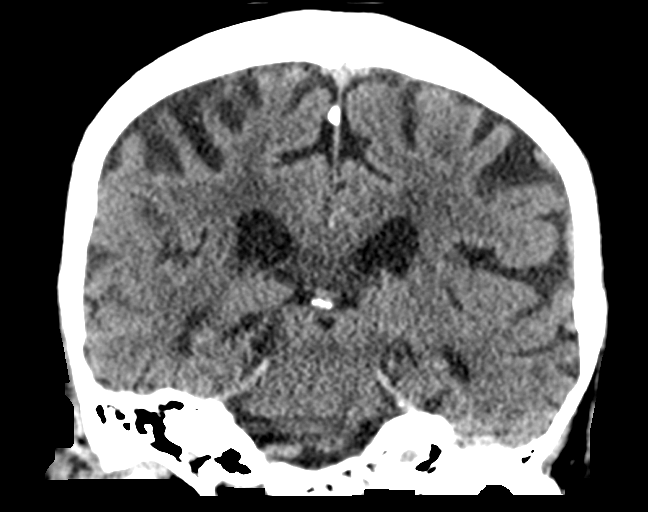
[im 38/68  brain]
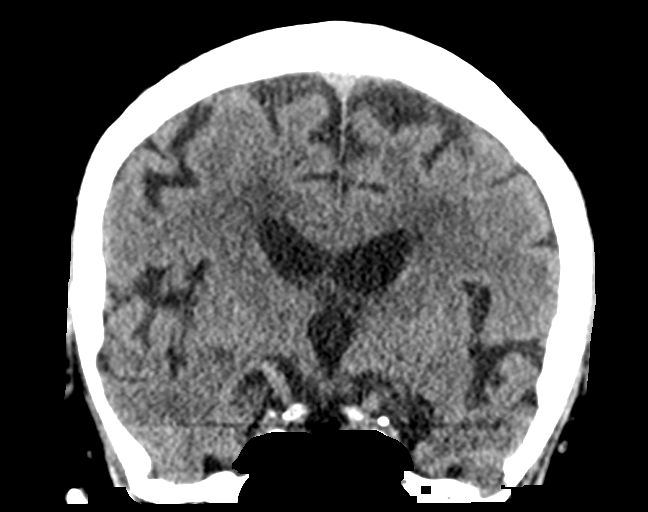

[Series 5: sagittal soft tissue · sagittal · 0.28mm/px · 3 of 61 slices shown]
[im 21/61  brain]
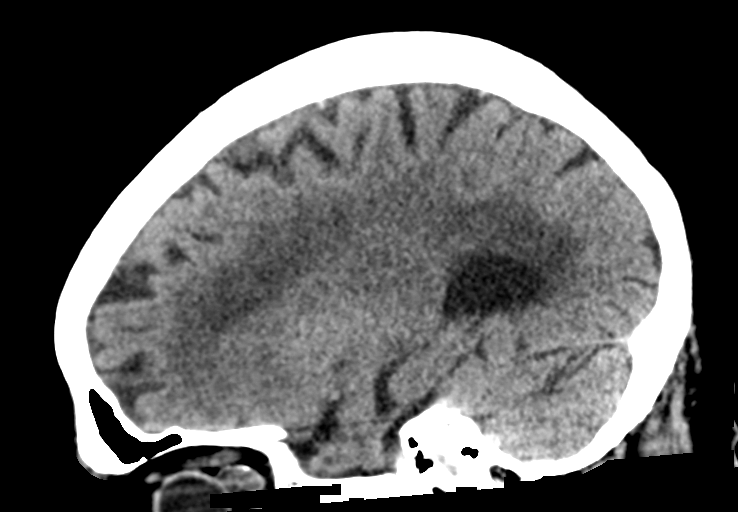
[im 31/61  brain]
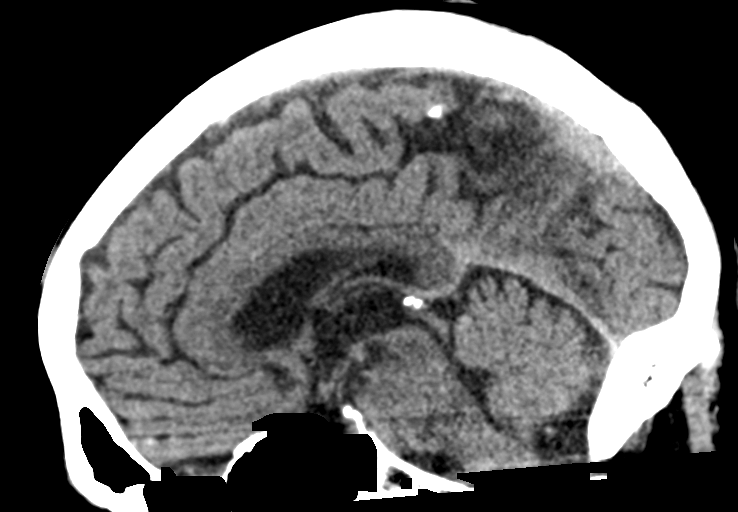
[im 41/61  brain]
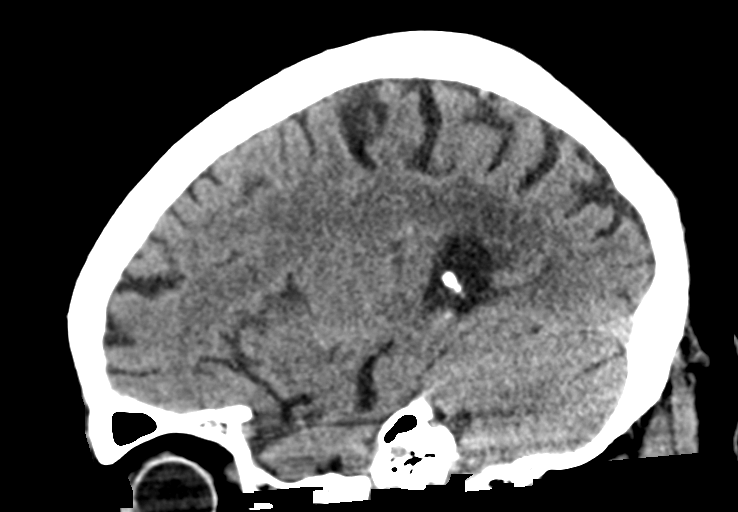

[14 of 45 positions shown; findings below may reference images not displayed]

FINDINGS: CT HEAD FINDINGS

Brain: Generalized atrophy. Normal ventricular morphology. No
midline shift or mass effect. Small vessel chronic ischemic changes
of deep cerebral white matter. Ossification of the anterior falx.
Old lateral LEFT cerebellar infarct. No intracranial hemorrhage,
mass lesion, evidence of acute infarction, or extra-axial fluid
collection.

Vascular: Minimal atherosclerotic calcifications at skull base

Skull: Intact

Sinuses/Orbits: Clear

Other: N/A

CT CERVICAL SPINE FINDINGS

Alignment: Normal

Skull base and vertebrae: Osseous mineralization normal. Skull base
intact. Multilevel disc space narrowing and endplate spur formation.
Minimal facet degenerative changes. Vertebral body heights
maintained. No fracture, subluxation or bone destruction.

Soft tissues and spinal canal: Prevertebral soft tissues normal
thickness

Disc levels:  No additional abnormalities

Upper chest: Lung apices clear

Other: N/A
IMPRESSION: Atrophy with small vessel chronic ischemic changes of deep cerebral
white matter.

Small old LEFT cerebellar infarct.

No acute intracranial abnormalities.

Degenerative disc disease changes cervical spine.

No acute cervical spine abnormalities.

## 2021-10-11 ENCOUNTER — Other Ambulatory Visit: Payer: Self-pay

## 2021-10-11 ENCOUNTER — Emergency Department: Payer: Medicare Other

## 2021-10-11 DIAGNOSIS — K56609 Unspecified intestinal obstruction, unspecified as to partial versus complete obstruction: Secondary | ICD-10-CM | POA: Diagnosis not present

## 2021-10-11 DIAGNOSIS — Z6823 Body mass index (BMI) 23.0-23.9, adult: Secondary | ICD-10-CM

## 2021-10-11 DIAGNOSIS — D649 Anemia, unspecified: Secondary | ICD-10-CM | POA: Diagnosis present

## 2021-10-11 DIAGNOSIS — K219 Gastro-esophageal reflux disease without esophagitis: Secondary | ICD-10-CM | POA: Diagnosis present

## 2021-10-11 DIAGNOSIS — E785 Hyperlipidemia, unspecified: Secondary | ICD-10-CM | POA: Diagnosis present

## 2021-10-11 DIAGNOSIS — F03918 Unspecified dementia, unspecified severity, with other behavioral disturbance: Secondary | ICD-10-CM | POA: Diagnosis not present

## 2021-10-11 DIAGNOSIS — E878 Other disorders of electrolyte and fluid balance, not elsewhere classified: Secondary | ICD-10-CM | POA: Diagnosis present

## 2021-10-11 DIAGNOSIS — E46 Unspecified protein-calorie malnutrition: Secondary | ICD-10-CM | POA: Diagnosis present

## 2021-10-11 DIAGNOSIS — Z66 Do not resuscitate: Secondary | ICD-10-CM | POA: Diagnosis not present

## 2021-10-11 DIAGNOSIS — E876 Hypokalemia: Secondary | ICD-10-CM | POA: Diagnosis not present

## 2021-10-11 DIAGNOSIS — K56600 Partial intestinal obstruction, unspecified as to cause: Principal | ICD-10-CM | POA: Diagnosis present

## 2021-10-11 DIAGNOSIS — E872 Acidosis, unspecified: Secondary | ICD-10-CM | POA: Diagnosis not present

## 2021-10-11 DIAGNOSIS — R4189 Other symptoms and signs involving cognitive functions and awareness: Secondary | ICD-10-CM | POA: Diagnosis present

## 2021-10-11 DIAGNOSIS — I10 Essential (primary) hypertension: Secondary | ICD-10-CM | POA: Diagnosis present

## 2021-10-11 DIAGNOSIS — E11649 Type 2 diabetes mellitus with hypoglycemia without coma: Secondary | ICD-10-CM | POA: Diagnosis present

## 2021-10-11 DIAGNOSIS — Z79899 Other long term (current) drug therapy: Secondary | ICD-10-CM

## 2021-10-11 DIAGNOSIS — H9190 Unspecified hearing loss, unspecified ear: Secondary | ICD-10-CM | POA: Diagnosis present

## 2021-10-11 DIAGNOSIS — N390 Urinary tract infection, site not specified: Secondary | ICD-10-CM | POA: Diagnosis present

## 2021-10-11 DIAGNOSIS — N400 Enlarged prostate without lower urinary tract symptoms: Secondary | ICD-10-CM | POA: Diagnosis present

## 2021-10-11 LAB — CBC
HCT: 36.9 % — ABNORMAL LOW (ref 39.0–52.0)
Hemoglobin: 11.4 g/dL — ABNORMAL LOW (ref 13.0–17.0)
MCH: 29.4 pg (ref 26.0–34.0)
MCHC: 30.9 g/dL (ref 30.0–36.0)
MCV: 95.1 fL (ref 80.0–100.0)
Platelets: 152 10*3/uL (ref 150–400)
RBC: 3.88 MIL/uL — ABNORMAL LOW (ref 4.22–5.81)
RDW: 13.6 % (ref 11.5–15.5)
WBC: 6.7 10*3/uL (ref 4.0–10.5)
nRBC: 0 % (ref 0.0–0.2)

## 2021-10-11 LAB — COMPREHENSIVE METABOLIC PANEL
ALT: 17 U/L (ref 0–44)
AST: 25 U/L (ref 15–41)
Albumin: 3.9 g/dL (ref 3.5–5.0)
Alkaline Phosphatase: 52 U/L (ref 38–126)
Anion gap: 7 (ref 5–15)
BUN: 20 mg/dL (ref 8–23)
CO2: 25 mmol/L (ref 22–32)
Calcium: 9.6 mg/dL (ref 8.9–10.3)
Chloride: 112 mmol/L — ABNORMAL HIGH (ref 98–111)
Creatinine, Ser: 1.18 mg/dL (ref 0.61–1.24)
GFR, Estimated: 55 mL/min — ABNORMAL LOW (ref >60)
Glucose, Bld: 157 mg/dL — ABNORMAL HIGH (ref 70–99)
Potassium: 4.2 mmol/L (ref 3.5–5.1)
Sodium: 144 mmol/L (ref 135–145)
Total Bilirubin: 0.5 mg/dL (ref 0.3–1.2)
Total Protein: 7 g/dL (ref 6.5–8.1)

## 2021-10-11 LAB — LIPASE, BLOOD: Lipase: 35 U/L (ref 11–51)

## 2021-10-11 MED ORDER — IOHEXOL 300 MG/ML  SOLN
100.0000 mL | Freq: Once | INTRAMUSCULAR | Status: AC | PRN
  Administered 2021-10-12: 100 mL via INTRAVENOUS

## 2021-10-11 NOTE — ED Provider Triage Note (Signed)
  Emergency Medicine Provider Triage Evaluation Note  Matthew Goodwin , a 88 y.o.male,  was evaluated in triage.  Pt complains of nausea, vomiting, diarrhea x1 day.  Is brought in by EMS from [facility].  Patient currently not endorsing any pain at this time.  Unable to get any additional history from him at this time.   Review of Systems  Positive: Nausea, vomiting, diarrhea Negative: Denies fever, chest pain, abdominal pain  Physical Exam   Vitals:   10/11/21 2126 10/11/21 2137  BP:  (!) 135/96  Pulse:  80  Resp:  20  Temp:  (!) 97.4 F (36.3 C)  SpO2: 97% 100%   Gen:   Awake, no distress   Resp:  Normal effort  MSK:   Moves extremities without difficulty  Other:    Medical Decision Making  Given the patient's initial medical screening exam, the following diagnostic evaluation has been ordered. The patient will be placed in the appropriate treatment space, once one is available, to complete the evaluation and treatment. I have discussed the plan of care with the patient and I have advised the patient that an ED physician or mid-level practitioner will reevaluate their condition after the test results have been received, as the results may give them additional insight into the type of treatment they may need.    Diagnostics: Labs, UA  Treatments: none immediately   Teodoro Spray, [place] 10/11/21 2219

## 2021-10-11 NOTE — ED Triage Notes (Signed)
EMS brings pt in from [facility] for c/o N/V/D this evening

## 2021-10-12 ENCOUNTER — Inpatient Hospital Stay
Admission: EM | Admit: 2021-10-12 | Discharge: 2021-10-18 | DRG: 389 | Disposition: A | Discharge status: Home Health | Payer: Medicare Other | Source: Skilled Nursing Facility | Attending: Internal Medicine | Admitting: Internal Medicine

## 2021-10-12 ENCOUNTER — Inpatient Hospital Stay: Payer: Medicare Other

## 2021-10-12 ENCOUNTER — Other Ambulatory Visit: Payer: Self-pay

## 2021-10-12 DIAGNOSIS — E876 Hypokalemia: Secondary | ICD-10-CM | POA: Diagnosis not present

## 2021-10-12 DIAGNOSIS — D649 Anemia, unspecified: Secondary | ICD-10-CM | POA: Diagnosis present

## 2021-10-12 DIAGNOSIS — E1122 Type 2 diabetes mellitus with diabetic chronic kidney disease: Secondary | ICD-10-CM

## 2021-10-12 DIAGNOSIS — Z66 Do not resuscitate: Secondary | ICD-10-CM | POA: Diagnosis not present

## 2021-10-12 DIAGNOSIS — E878 Other disorders of electrolyte and fluid balance, not elsewhere classified: Secondary | ICD-10-CM | POA: Diagnosis present

## 2021-10-12 DIAGNOSIS — E11649 Type 2 diabetes mellitus with hypoglycemia without coma: Secondary | ICD-10-CM | POA: Diagnosis present

## 2021-10-12 DIAGNOSIS — K56600 Partial intestinal obstruction, unspecified as to cause: Secondary | ICD-10-CM | POA: Diagnosis present

## 2021-10-12 DIAGNOSIS — R627 Adult failure to thrive: Secondary | ICD-10-CM | POA: Diagnosis not present

## 2021-10-12 DIAGNOSIS — Z79899 Other long term (current) drug therapy: Secondary | ICD-10-CM | POA: Diagnosis not present

## 2021-10-12 DIAGNOSIS — E46 Unspecified protein-calorie malnutrition: Secondary | ICD-10-CM | POA: Diagnosis present

## 2021-10-12 DIAGNOSIS — N400 Enlarged prostate without lower urinary tract symptoms: Secondary | ICD-10-CM | POA: Diagnosis present

## 2021-10-12 DIAGNOSIS — Z515 Encounter for palliative care: Secondary | ICD-10-CM | POA: Diagnosis not present

## 2021-10-12 DIAGNOSIS — F03918 Unspecified dementia, unspecified severity, with other behavioral disturbance: Secondary | ICD-10-CM | POA: Diagnosis present

## 2021-10-12 DIAGNOSIS — K219 Gastro-esophageal reflux disease without esophagitis: Secondary | ICD-10-CM | POA: Diagnosis present

## 2021-10-12 DIAGNOSIS — I10 Essential (primary) hypertension: Secondary | ICD-10-CM | POA: Diagnosis present

## 2021-10-12 DIAGNOSIS — H9190 Unspecified hearing loss, unspecified ear: Secondary | ICD-10-CM | POA: Diagnosis present

## 2021-10-12 DIAGNOSIS — E785 Hyperlipidemia, unspecified: Secondary | ICD-10-CM

## 2021-10-12 DIAGNOSIS — E119 Type 2 diabetes mellitus without complications: Secondary | ICD-10-CM

## 2021-10-12 DIAGNOSIS — K56609 Unspecified intestinal obstruction, unspecified as to partial versus complete obstruction: Secondary | ICD-10-CM | POA: Diagnosis not present

## 2021-10-12 DIAGNOSIS — N1831 Chronic kidney disease, stage 3a: Secondary | ICD-10-CM | POA: Diagnosis not present

## 2021-10-12 DIAGNOSIS — E872 Acidosis, unspecified: Secondary | ICD-10-CM | POA: Diagnosis not present

## 2021-10-12 DIAGNOSIS — Z6823 Body mass index (BMI) 23.0-23.9, adult: Secondary | ICD-10-CM | POA: Diagnosis not present

## 2021-10-12 DIAGNOSIS — N390 Urinary tract infection, site not specified: Secondary | ICD-10-CM | POA: Diagnosis present

## 2021-10-12 DIAGNOSIS — R4189 Other symptoms and signs involving cognitive functions and awareness: Secondary | ICD-10-CM | POA: Diagnosis present

## 2021-10-12 HISTORY — DX: Hyperlipidemia, unspecified: E78.5

## 2021-10-12 LAB — BASIC METABOLIC PANEL
Anion gap: 7 (ref 5–15)
BUN: 20 mg/dL (ref 8–23)
CO2: 24 mmol/L (ref 22–32)
Calcium: 9.1 mg/dL (ref 8.9–10.3)
Chloride: 113 mmol/L — ABNORMAL HIGH (ref 98–111)
Creatinine, Ser: 1.15 mg/dL (ref 0.61–1.24)
GFR, Estimated: 57 mL/min — ABNORMAL LOW (ref >60)
Glucose, Bld: 121 mg/dL — ABNORMAL HIGH (ref 70–99)
Potassium: 4.3 mmol/L (ref 3.5–5.1)
Sodium: 144 mmol/L (ref 135–145)

## 2021-10-12 LAB — CBC
HCT: 34.5 % — ABNORMAL LOW (ref 39.0–52.0)
Hemoglobin: 10.9 g/dL — ABNORMAL LOW (ref 13.0–17.0)
MCH: 29.1 pg (ref 26.0–34.0)
MCHC: 31.6 g/dL (ref 30.0–36.0)
MCV: 92.2 fL (ref 80.0–100.0)
Platelets: 142 10*3/uL — ABNORMAL LOW (ref 150–400)
RBC: 3.74 MIL/uL — ABNORMAL LOW (ref 4.22–5.81)
RDW: 13.7 % (ref 11.5–15.5)
WBC: 5.5 10*3/uL (ref 4.0–10.5)
nRBC: 0 % (ref 0.0–0.2)

## 2021-10-12 LAB — URINALYSIS, ROUTINE W REFLEX MICROSCOPIC
Bacteria, UA: NONE SEEN
Bilirubin Urine: NEGATIVE
Glucose, UA: NEGATIVE mg/dL
Ketones, ur: NEGATIVE mg/dL
Nitrite: NEGATIVE
Protein, ur: NEGATIVE mg/dL
Specific Gravity, Urine: >1.046 — ABNORMAL HIGH (ref 1.005–1.030)
pH: 5 (ref 5.0–8.0)

## 2021-10-12 LAB — CBG MONITORING, ED
Glucose-Capillary: 102 mg/dL — ABNORMAL HIGH (ref 70–99)
Glucose-Capillary: 105 mg/dL — ABNORMAL HIGH (ref 70–99)
Glucose-Capillary: 86 mg/dL (ref 70–99)

## 2021-10-12 LAB — HEMOGLOBIN A1C
Hgb A1c MFr Bld: 5.5 % (ref 4.8–5.6)
Mean Plasma Glucose: 111.15 mg/dL

## 2021-10-12 MED ORDER — LIDOCAINE 5 % EX PTCH
1.0000 | MEDICATED_PATCH | CUTANEOUS | Status: DC
  Filled 2021-10-12 (×3): qty 1

## 2021-10-12 MED ORDER — ONDANSETRON HCL 4 MG PO TABS: 4.0000 mg | ORAL_TABLET | Freq: Four times a day (QID) | ORAL | Status: DC | PRN

## 2021-10-12 MED ORDER — METOPROLOL TARTRATE 5 MG/5ML IV SOLN
2.5000 mg | Freq: Four times a day (QID) | INTRAVENOUS | Status: DC
  Administered 2021-10-12 – 2021-10-16 (×13): 2.5 mg via INTRAVENOUS
  Filled 2021-10-12 (×13): qty 5

## 2021-10-12 MED ORDER — TAMSULOSIN HCL 0.4 MG PO CAPS
0.4000 mg | ORAL_CAPSULE | Freq: Every day | ORAL | Status: DC
  Administered 2021-10-15 – 2021-10-18 (×4): 0.4 mg via ORAL
  Filled 2021-10-12 (×6): qty 1

## 2021-10-12 MED ORDER — LOSARTAN POTASSIUM 50 MG PO TABS
50.0000 mg | ORAL_TABLET | Freq: Every day | ORAL | Status: DC
  Filled 2021-10-12: qty 1

## 2021-10-12 MED ORDER — ONDANSETRON HCL 4 MG/2ML IJ SOLN: 4.0000 mg | Freq: Four times a day (QID) | INTRAMUSCULAR | Status: DC | PRN

## 2021-10-12 MED ORDER — HYDRALAZINE HCL 20 MG/ML IJ SOLN
10.0000 mg | Freq: Four times a day (QID) | INTRAMUSCULAR | Status: DC | PRN
  Administered 2021-10-14: 10 mg via INTRAVENOUS
  Filled 2021-10-12: qty 1

## 2021-10-12 MED ORDER — VITAMIN D (ERGOCALCIFEROL) 1.25 MG (50000 UNIT) PO CAPS
50000.0000 [IU] | ORAL_CAPSULE | ORAL | Status: DC
  Filled 2021-10-12: qty 1

## 2021-10-12 MED ORDER — ENOXAPARIN SODIUM 40 MG/0.4ML IJ SOSY
40.0000 mg | PREFILLED_SYRINGE | INTRAMUSCULAR | Status: DC
  Administered 2021-10-12 – 2021-10-18 (×7): 40 mg via SUBCUTANEOUS
  Filled 2021-10-12 (×7): qty 0.4

## 2021-10-12 MED ORDER — ACETAMINOPHEN 650 MG RE SUPP: 650.0000 mg | Freq: Four times a day (QID) | RECTAL | Status: DC | PRN

## 2021-10-12 MED ORDER — METOPROLOL TARTRATE 5 MG/5ML IV SOLN: 2.5000 mg | Freq: Four times a day (QID) | INTRAVENOUS | Status: DC

## 2021-10-12 MED ORDER — FERROUS SULFATE 325 (65 FE) MG PO TABS
325.0000 mg | ORAL_TABLET | Freq: Every day | ORAL | Status: DC
  Filled 2021-10-12: qty 1

## 2021-10-12 MED ORDER — METOPROLOL SUCCINATE ER 50 MG PO TB24
100.0000 mg | ORAL_TABLET | Freq: Every day | ORAL | Status: DC
  Filled 2021-10-12: qty 2

## 2021-10-12 MED ORDER — OMEGA-3-ACID ETHYL ESTERS 1 G PO CAPS
1000.0000 mg | ORAL_CAPSULE | Freq: Two times a day (BID) | ORAL | Status: DC
  Administered 2021-10-15 – 2021-10-18 (×4): 1000 mg via ORAL
  Filled 2021-10-12 (×9): qty 1

## 2021-10-12 MED ORDER — SODIUM CHLORIDE 0.9 % IV SOLN: INTRAVENOUS | Status: DC

## 2021-10-12 MED ORDER — INSULIN ASPART 100 UNIT/ML IJ SOLN: 0.0000 [IU] | Freq: Three times a day (TID) | INTRAMUSCULAR | Status: DC

## 2021-10-12 MED ORDER — ACETAMINOPHEN 325 MG PO TABS: 650.0000 mg | ORAL_TABLET | Freq: Four times a day (QID) | ORAL | Status: DC | PRN

## 2021-10-12 MED ORDER — SODIUM CHLORIDE 0.9 % IV SOLN
1.0000 g | INTRAVENOUS | Status: DC
  Administered 2021-10-12 – 2021-10-14 (×3): 1 g via INTRAVENOUS
  Filled 2021-10-12: qty 10
  Filled 2021-10-12: qty 1
  Filled 2021-10-12: qty 10

## 2021-10-12 MED ORDER — TRAZODONE HCL 50 MG PO TABS
25.0000 mg | ORAL_TABLET | Freq: Every evening | ORAL | Status: DC | PRN
  Administered 2021-10-17: 25 mg via ORAL
  Filled 2021-10-12: qty 1

## 2021-10-12 MED ORDER — FENTANYL CITRATE PF 50 MCG/ML IJ SOSY: 50.0000 ug | PREFILLED_SYRINGE | INTRAMUSCULAR | Status: DC | PRN

## 2021-10-12 MED ORDER — MORPHINE SULFATE (PF) 2 MG/ML IV SOLN: 2.0000 mg | INTRAVENOUS | Status: DC | PRN

## 2021-10-12 MED ORDER — FLUOROMETHOLONE 0.1 % OP SUSP
1.0000 [drp] | Freq: Every day | OPHTHALMIC | Status: DC
  Administered 2021-10-12 – 2021-10-18 (×7): 1 [drp] via OPHTHALMIC
  Filled 2021-10-12: qty 5

## 2021-10-12 MED ORDER — ATORVASTATIN CALCIUM 20 MG PO TABS: 10.0000 mg | ORAL_TABLET | Freq: Every day | ORAL | Status: DC

## 2021-10-12 MED ORDER — LORATADINE 10 MG PO TABS
10.0000 mg | ORAL_TABLET | Freq: Every day | ORAL | Status: DC
  Administered 2021-10-14 – 2021-10-18 (×5): 10 mg via ORAL
  Filled 2021-10-12 (×6): qty 1

## 2021-10-12 NOTE — Assessment & Plan Note (Signed)
-   We will continue his Flomax. 

## 2021-10-12 NOTE — Assessment & Plan Note (Deleted)
-   The patient will be admitted to a medical bed. - He will be kept n.p.o. except for medications with sips of water as tolerated. - We will continue hydration with IV normal saline and optimize electrolytes. - We will obtain a follow-up two-view abdomen x-ray later this a.m. - Pain management will be provided. - General surgery consult will be obtained. - I notified Dr. Dahlia Byes about the patient.

## 2021-10-12 NOTE — ED Notes (Signed)
Pt was cleansed of incontinent urine and stool by ed tech shawn.

## 2021-10-12 NOTE — Assessment & Plan Note (Addendum)
Continue with statin therapy.  ?

## 2021-10-12 NOTE — ED Provider Notes (Addendum)
[facility] Provider Note    Event Date/Time   First MD Initiated Contact with Patient 10/12/21 0209     (approximate)   History   No chief complaint on file.   HPI  Matthew Goodwin is a 88 y.o. male history of dementia, hypertension, diabetes, hyperlipidemia who presents to the emergency department with nausea, vomiting, diarrhea and abdominal pain that started tonight.  Patient is not able to provide much history due to his dementia.  Patient came with EMS from [facility].   History provided by patient, EMS.    Past Medical History:  Diagnosis Date   Anemia    BPH (benign prostatic hyperplasia)    Diabetes mellitus without complication (HCC)    Edema    Hypertension    Hypoglycemia    Renal insufficiency    Urinary retention     Past Surgical History:  Procedure Laterality Date   unknown      MEDICATIONS:  Prior to Admission medications   Medication Sig Start Date End Date Taking? Authorizing Provider  acetaminophen (TYLENOL) 325 MG tablet Take 650 mg by mouth every 6 (six) hours as needed for mild pain or moderate pain.    [provider]  atorvastatin (LIPITOR) 10 MG tablet Take 10 mg by mouth at bedtime.    [provider]  docusate sodium (COLACE) 100 MG capsule Take 100 mg by mouth daily.    [provider]  ferrous sulfate 325 (65 FE) MG tablet Take 325 mg by mouth daily.    [provider]  fluorometholone (FML) 0.1 % ophthalmic suspension Place 1 drop into both eyes daily.    [provider]  levocetirizine (XYZAL) 5 MG tablet Take 5 mg by mouth daily.     [provider]  lidocaine (LIDODERM) 5 % Place 1 patch onto the skin daily. Remove & Discard patch within 12 hours or as directed by MD 05/08/19   Marrion Coy, MD  losartan (COZAAR) 50 MG tablet Take 50 mg by mouth daily.     [provider]  metoprolol succinate (TOPROL-XL) 100 MG 24 hr  tablet Take 100 mg by mouth daily.     [provider]  Omega-3 Fatty Acids (FISH OIL) 1000 MG CAPS Take 1,000 mg by mouth 2 (two) times daily.    [provider]  oxyCODONE (OXY IR/ROXICODONE) 5 MG immediate release tablet Take 1 tablet (5 mg total) by mouth every 8 (eight) hours as needed for moderate pain. 05/08/19   Marrion Coy, MD  tamsulosin (FLOMAX) 0.4 MG CAPS capsule Take 0.4 mg by mouth daily.     [provider]  Vitamin D, Ergocalciferol, (DRISDOL) 1.25 MG (50000 UNIT) CAPS capsule Take 50,000 Units by mouth every Wednesday.    [provider]    Physical Exam   Triage Vital Signs: ED Triage Vitals  Enc Vitals Group     BP 10/11/21 2137 (!) 135/96     Pulse Rate 10/11/21 2137 80     Resp 10/11/21 2137 20     Temp 10/11/21 2137 (!) 97.4 F (36.3 C)     Temp Source 10/11/21 2137 Oral     SpO2 10/11/21 2126 97 %     Weight 10/11/21 2141 185 lb (83.9 kg)     Height --      Head Circumference --      Peak Flow --      Pain Score 10/11/21 2141 0  Pain Loc --      Pain Edu? --      Excl. in [place]? --     Most recent vital signs: Vitals:   10/11/21 2137 10/12/21 0057  BP: (!) 135/96 (!) 147/86  Pulse: 80 68  Resp: 20 18  Temp: (!) 97.4 F (36.3 C) 98.3 F (36.8 C)  SpO2: 100% 100%    CONSTITUTIONAL: Alert, elderly, thin, hard of hearing, intermittently agitated, poor historian HEAD: Normocephalic, atraumatic EYES: Conjunctivae clear, pupils appear equal, sclera nonicteric ENT: normal nose; moist mucous membranes NECK: Supple, normal ROM CARD: RRR; S1 and S2 appreciated; no murmurs, no clicks, no rubs, no gallops RESP: Normal chest excursion without splinting or tachypnea; breath sounds clear and equal bilaterally; no wheezes, no rhonchi, no rales, no hypoxia or respiratory distress, speaking full sentences ABD/GI: Diffusely tender to palpation without guarding or rebound, abdomen soft and nondistended. BACK: The back appears  normal EXT: Normal ROM in all joints; no deformity noted, no edema; no cyanosis SKIN: Normal color for age and race; warm; no rash on exposed skin NEURO: Moves all extremities equally, normal speech PSYCH: The patient's mood and manner are appropriate.   ED Results / Procedures / Treatments   LABS: (all labs ordered are listed, but only abnormal results are displayed) Labs Reviewed  COMPREHENSIVE METABOLIC PANEL - Abnormal; Notable for the following components:      Result Value   Chloride 112 (*)    Glucose, Bld 157 (*)    GFR, Estimated 55 (*)    All other components within normal limits  CBC - Abnormal; Notable for the following components:   RBC 3.88 (*)    Hemoglobin 11.4 (*)    HCT 36.9 (*)    All other components within normal limits  URINE CULTURE  LIPASE, BLOOD  URINALYSIS, ROUTINE W REFLEX MICROSCOPIC     EKG:  EKG Interpretation  Date/Time:  Tuesday October 11 2021 21:48:59 EDT Ventricular Rate:  88 PR Interval:  174 QRS Duration: 88 QT Interval:  380 QTC Calculation: 459 R Axis:   -24 Text Interpretation: Sinus rhythm with Premature atrial complexes ST & T wave abnormality, consider lateral ischemia Abnormal ECG When compared with ECG of 06-May-2019 09:58, PREVIOUS ECG IS PRESENT No significant change since last tracing Confirmed by Pryor Curia [phone]) on 10/12/2021 2:23:52 AM         RADIOLOGY: My personal review and interpretation of imaging: CT scan concerning for small bowel obstruction.  I have personally reviewed all radiology reports.   CT Abdomen Pelvis W Contrast  Result Date: 10/12/2021 CLINICAL DATA:  Nausea, vomiting, and diarrhea for 1 day. EXAM: CT ABDOMEN AND PELVIS WITH CONTRAST TECHNIQUE: Multidetector CT imaging of the abdomen and pelvis was performed using the standard protocol following bolus administration of intravenous contrast. RADIATION DOSE REDUCTION: This exam was performed according to the departmental dose-optimization  program which includes automated exposure control, adjustment of the mA and/or kV according to patient size and/or use of iterative reconstruction technique. CONTRAST:  147mL OMNIPAQUE IOHEXOL 300 MG/ML  SOLN COMPARISON:  05/06/2019 FINDINGS: Lower chest: Atelectasis in the lung bases.  Cardiac enlargement. Hepatobiliary: No focal liver abnormality is seen. No gallstones, gallbladder wall thickening, or biliary dilatation. Pancreas: Unremarkable. No pancreatic ductal dilatation or surrounding inflammatory changes. Spleen: Calcified granulomas Adrenals/Urinary Tract: No adrenal gland nodules. 2 cm cyst in the right kidney. No imaging follow-up is indicated. Kidneys are otherwise unremarkable. Diffuse bladder wall thickening may indicate cystitis. Stomach/Bowel: Stomach is  not abnormally distended. Prominent fluid distention of left upper quadrant and left pelvic small bowel with decompression of the distal small bowel. Changes likely represent obstruction. Colon is decompressed. Appendix is normal. Vascular/Lymphatic: Aortic atherosclerosis. No enlarged abdominal or pelvic lymph nodes. Reproductive: Prostate gland is not enlarged. Other: No free air or free fluid in the abdomen. Midline sutures consistent with previous surgery. Abdominal wall musculature appears intact. Musculoskeletal: Degenerative changes. Metallic foreign body adjacent to the right acetabulum. Old compression of L1 with increased loss of vertebral height since previous study. IMPRESSION: 1. Fluid distended left abdominal small bowel with decompression of distal small bowel likely representing obstruction. 2. Aortic atherosclerosis. 3. Prominent bladder wall thickening likely indicating cystitis. Correlate with urinalysis. Electronically Signed   By: Burman Nieves M.D.   On: 10/12/2021 00:33     PROCEDURES:  Critical Care performed: No      Procedures    IMPRESSION / MDM / ASSESSMENT AND PLAN / ED COURSE  I reviewed the triage  vital signs and the nursing notes.    Patient here with abdominal pain, nausea, vomiting and diarrhea from his nursing facility.     DIFFERENTIAL DIAGNOSIS (includes but not limited to):   Colitis, gastroenteritis, UTI, cholelithiasis, cholecystitis, pancreatitis, bowel obstruction, colitis, diverticulitis, appendicitis   Patient's presentation is most consistent with acute presentation with potential threat to life or bodily function.   PLAN: Labs obtained from triage.  No leukocytosis.  Normal electrolytes other than minimally elevated chloride level.  Normal renal function.  LFTs and lipase normal.  CT scan reviewed and interpreted by myself and the radiologist and shows left abdominal small bowel obstruction without surgical complication.  Also has bladder wall thickening concerning for cystitis but urine is pending.  Will obtain urinalysis, urine culture.  We will start IV fluids, give pain and nausea medicine and place NG tube.  Will admit to the hospitalist.   MEDICATIONS GIVEN IN ED: Medications  fentaNYL (SUBLIMAZE) injection 50 mcg (has no administration in time range)  ondansetron (ZOFRAN) injection 4 mg (has no administration in time range)  0.9 %  sodium chloride infusion (has no administration in time range)  iohexol (OMNIPAQUE) 300 MG/ML solution 100 mL (100 mLs Intravenous Contrast Given 10/12/21 0010)     ED COURSE: Urine pending.   CONSULTS:  Consulted and discussed patient's case with hospitalist, Dr. Arville Care.  I have recommended admission and consulting physician agrees and will place admission orders.  Patient (and family if present) agree with this plan.   I reviewed all nursing notes, vitals, pertinent previous records.  All labs, EKGs, imaging ordered have been independently reviewed and interpreted by myself.    OUTSIDE RECORDS REVIEWED: Reviewed patient's previous admission note in May 2021.       FINAL CLINICAL IMPRESSION(S) / ED DIAGNOSES   Final  diagnoses:  SBO (small bowel obstruction) (HCC)     Rx / DC Orders   ED Discharge Orders     None        Note:  This document was prepared using Dragon voice recognition software and may include unintentional dictation errors.   Adele Milson, Layla Maw, DO 10/12/21 0239    Tashai Catino, Layla Maw, DO 10/12/21 1505

## 2021-10-12 NOTE — H&P (Signed)
[place]   PATIENT NAME: Matthew Goodwin    MR#:  [id number]  DATE OF BIRTH:  1920/02/27  DATE OF ADMISSION:  10/12/2021  PRIMARY CARE PHYSICIAN: Sherrie Mustache, MD   Patient is coming from: Home  REQUESTING/REFERRING PHYSICIAN: Ward, Layla Maw, DO.  CHIEF COMPLAINT:  No chief complaint on file.   HISTORY OF PRESENT ILLNESS:  Bobie Kistler is a 88 y.o. African-American male with medical history significant for type 2 diabetes mellitus, BPH, dementia and hypertension, who presented to the emergency room with acute onset of generalized abdominal tenderness with associated nausea and vomiting as well as diarrhea that started tonight at his assisted living facility.  The patient is a very poor historian.  He denies any fever or chills.  No dysuria, oliguria or hematuria or flank pain.  No bleeding diathesis.  ED Course: When came to the ER, BP was 135/96 with temperature 97.4 and otherwise unremarkable vital signs.  Labs revealed unremarkable CMP and CBC showed anemia.  UA showed 6-10 WBCs with more than 1046% gravity and 6-10 RBCs with trace leukocytes.  Urine culture was sent. EKG as reviewed by me : EKG showed sinus rhythm with a rate of 88 with PACs and T wave inversion laterally Imaging: Abdomen portable x-ray showed NG tube tip and side port projecting within the stomach. Abdominal pelvic CT scan showed the following: 1. Fluid distended left abdominal small bowel with decompression of distal small bowel likely representing obstruction. 2. Aortic atherosclerosis. 3. Prominent bladder wall thickening likely indicating cystitis. Correlate with urinalysis.  The patient was given 50 mcg of IV fentanyl, 4 mg of IV Zofran and 125 mL/h IV normal saline infusion.  He will be admitted to a medical bed for further evaluation and management  PAST MEDICAL HISTORY:   Past Medical History:  Diagnosis Date   Anemia    BPH (benign prostatic hyperplasia)    Diabetes mellitus without  complication (HCC)    Edema    Hypertension    Hypoglycemia    Renal insufficiency    Urinary retention     PAST SURGICAL HISTORY:   Past Surgical History:  Procedure Laterality Date   unknown    Unobtainable due to his dementia.  SOCIAL HISTORY:   Social History   Tobacco Use   Smoking status: Never   Smokeless tobacco: Never  Substance Use Topics   Alcohol use: Not Currently    FAMILY HISTORY:   Family History  Family history unknown: Yes  Unobtainable due to his dementia.  DRUG ALLERGIES:  No Known Allergies  REVIEW OF SYSTEMS:   ROS As per history of present illness, otherwise on a normal due to his dementia and being very poor historian.   MEDICATIONS AT HOME:   Prior to Admission medications   Medication Sig Start Date End Date Taking? Authorizing Provider  acetaminophen (TYLENOL) 325 MG tablet Take 650 mg by mouth every 6 (six) hours as needed for mild pain or moderate pain.    [provider]  atorvastatin (LIPITOR) 10 MG tablet Take 10 mg by mouth at bedtime.    [provider]  docusate sodium (COLACE) 100 MG capsule Take 100 mg by mouth daily.    [provider]  ferrous sulfate 325 (65 FE) MG tablet Take 325 mg by mouth daily.    [provider]  fluorometholone (FML) 0.1 % ophthalmic suspension Place 1 drop into both eyes daily.    [provider]  levocetirizine (  XYZAL) 5 MG tablet Take 5 mg by mouth daily.     [provider]  lidocaine (LIDODERM) 5 % Place 1 patch onto the skin daily. Remove & Discard patch within 12 hours or as directed by MD 05/08/19   Marrion Coy, MD  losartan (COZAAR) 50 MG tablet Take 50 mg by mouth daily.     [provider]  metoprolol succinate (TOPROL-XL) 100 MG 24 hr tablet Take 100 mg by mouth daily.     [provider]  Omega-3 Fatty Acids (FISH OIL) 1000 MG CAPS Take 1,000 mg by mouth 2 (two) times daily.    [provider]  oxyCODONE  (OXY IR/ROXICODONE) 5 MG immediate release tablet Take 1 tablet (5 mg total) by mouth every 8 (eight) hours as needed for moderate pain. 05/08/19   Marrion Coy, MD  tamsulosin (FLOMAX) 0.4 MG CAPS capsule Take 0.4 mg by mouth daily.     [provider]  Vitamin D, Ergocalciferol, (DRISDOL) 1.25 MG (50000 UNIT) CAPS capsule Take 50,000 Units by mouth every Wednesday.    [provider]      VITAL SIGNS:  Blood pressure (!) 159/103, pulse 66, temperature 98.3 F (36.8 C), temperature source Oral, resp. rate 20, height 6\' 2"  (1.88 m), weight 83.9 kg, SpO2 98 %.  PHYSICAL EXAMINATION:  Physical Exam  GENERAL:  88 y.o.-year-old Afro-American male patient lying in the bed with no acute distress.  EYES: Pupils equal, round, reactive to light and accommodation. No scleral icterus. Extraocular muscles intact.  HEENT: Head atraumatic, normocephalic. Oropharynx and nasopharynx clear.  NECK:  Supple, no jugular venous distention. No thyroid enlargement, no tenderness.  LUNGS: Normal breath sounds bilaterally, no wheezing, rales,rhonchi or crepitation. No use of accessory muscles of respiration.  CARDIOVASCULAR: Regular rate and rhythm, S1, S2 normal. No murmurs, rubs, or gallops.  ABDOMEN: Soft, nondistended, with mild generalized tenderness without rebound tenderness guarding or rigidity.  Significant diminished bowel sounds present. No organomegaly or mass.  EXTREMITIES: No pedal edema, cyanosis, or clubbing.  NEUROLOGIC: Cranial nerves II through XII are intact. Muscle strength 5/5 in all extremities. Sensation intact. Gait not checked.  PSYCHIATRIC: The patient is alert and oriented x 3.  Normal affect and good eye contact. SKIN: No obvious rash, lesion, or ulcer.   LABORATORY PANEL:   CBC Recent Labs  Lab 10/12/21 0610  WBC 5.5  HGB 10.9*  HCT 34.5*  PLT 142*    ------------------------------------------------------------------------------------------------------------------  Chemistries  Recent Labs  Lab 10/11/21 2143 10/12/21 0610  NA 144 144  K 4.2 4.3  CL 112* 113*  CO2 25 24  GLUCOSE 157* 121*  BUN 20 20  CREATININE 1.18 1.15  CALCIUM 9.6 9.1  AST 25  --   ALT 17  --   ALKPHOS 52  --   BILITOT 0.5  --    ------------------------------------------------------------------------------------------------------------------  Cardiac Enzymes No results for input(s): "TROPONINI" in the last 168 hours. ------------------------------------------------------------------------------------------------------------------  RADIOLOGY:  DG Abd Portable 1 View  Result Date: 10/12/2021 CLINICAL DATA:  Nasogastric tube placement EXAM: PORTABLE ABDOMEN - 1 VIEW COMPARISON:  None Available. FINDINGS: Nasogastric tube tip and side port project within the stomach. Lung bases are clear. Dilated gas-filled bowel in the upper abdomen. Excreted contrast within the renal collecting systems. IMPRESSION: Nasogastric tube tip and side port project within the stomach. Electronically Signed   By: 12/12/2021 M.D.   On: 10/12/2021 03:46   CT Abdomen Pelvis W Contrast  Result Date: 10/12/2021 CLINICAL  DATA:  Nausea, vomiting, and diarrhea for 1 day. EXAM: CT ABDOMEN AND PELVIS WITH CONTRAST TECHNIQUE: Multidetector CT imaging of the abdomen and pelvis was performed using the standard protocol following bolus administration of intravenous contrast. RADIATION DOSE REDUCTION: This exam was performed according to the departmental dose-optimization program which includes automated exposure control, adjustment of the mA and/or kV according to patient size and/or use of iterative reconstruction technique. CONTRAST:  139mL OMNIPAQUE IOHEXOL 300 MG/ML  SOLN COMPARISON:  05/06/2019 FINDINGS: Lower chest: Atelectasis in the lung bases.  Cardiac enlargement. Hepatobiliary: No  focal liver abnormality is seen. No gallstones, gallbladder wall thickening, or biliary dilatation. Pancreas: Unremarkable. No pancreatic ductal dilatation or surrounding inflammatory changes. Spleen: Calcified granulomas Adrenals/Urinary Tract: No adrenal gland nodules. 2 cm cyst in the right kidney. No imaging follow-up is indicated. Kidneys are otherwise unremarkable. Diffuse bladder wall thickening may indicate cystitis. Stomach/Bowel: Stomach is not abnormally distended. Prominent fluid distention of left upper quadrant and left pelvic small bowel with decompression of the distal small bowel. Changes likely represent obstruction. Colon is decompressed. Appendix is normal. Vascular/Lymphatic: Aortic atherosclerosis. No enlarged abdominal or pelvic lymph nodes. Reproductive: Prostate gland is not enlarged. Other: No free air or free fluid in the abdomen. Midline sutures consistent with previous surgery. Abdominal wall musculature appears intact. Musculoskeletal: Degenerative changes. Metallic foreign body adjacent to the right acetabulum. Old compression of L1 with increased loss of vertebral height since previous study. IMPRESSION: 1. Fluid distended left abdominal small bowel with decompression of distal small bowel likely representing obstruction. 2. Aortic atherosclerosis. 3. Prominent bladder wall thickening likely indicating cystitis. Correlate with urinalysis. Electronically Signed   By: Lucienne Capers M.D.   On: 10/12/2021 00:33      IMPRESSION AND PLAN:  Assessment and Plan: * SBO (small bowel obstruction) ([place]) - The patient will be admitted to a medical bed. - He will be kept n.p.o. except for medications with sips of water as tolerated. - We will continue hydration with IV normal saline and optimize electrolytes. - We will obtain a follow-up two-view abdomen x-ray later this a.m. - Pain management will be provided. - General surgery consult will be obtained. - I notified Dr. Dahlia Byes about  the patient.  Acute lower UTI -He will be placed on IV Rocephin and will follow urine culture and sensitivity.  Dyslipidemia - We will continue statin therapy and Lovaza.  BPH (benign prostatic hyperplasia) - We will continue his Flomax.  Essential hypertension - We will continue his antihypertensives and place him on as needed IV labetalol.  GERD without esophagitis - We will continue H2 blocker therapy.  DM type 2 (diabetes mellitus, type 2) ([place]) - The patient will be placed on supplement coverage with NovoLog       DVT prophylaxis: Lovenox.  Advanced Care Planning:  Code Status: full code.  Family Communication:  The plan of care was discussed in details with the patient (and family). I answered all questions. The patient agreed to proceed with the above mentioned plan. Further management will depend upon hospital course. Disposition Plan: Back to previous home environment Consults called: General surgery. All the records are reviewed and case discussed with ED provider.  Status is: Inpatient    At the time of the admission, it appears that the appropriate admission status for this patient is inpatient.  This is judged to be reasonable and necessary in order to provide the required intensity of service to ensure the patient's safety given the presenting symptoms, physical  exam findings and initial radiographic and laboratory data in the context of comorbid conditions.  The patient requires inpatient status due to high intensity of service, high risk of further deterioration and high frequency of surveillance required.  I certify that at the time of admission, it is my clinical judgment that the patient will require inpatient hospital care extending more than 2 midnights.                            Dispo: The patient is from: Home              Anticipated d/c is to: Home              Patient currently is not medically stable to d/c.              Difficult to place  patient: No  Hannah Beat M.D on 10/12/2021 at 6:35 AM  [facility]   From 7 PM-7 AM, contact night-coverage [email or web address]  CC: Primary care physician; Sherrie Mustache, MD

## 2021-10-12 NOTE — Assessment & Plan Note (Addendum)
Continue with insulin sliding scale for glucose cover and monitoring Fasting glucose this am 114 mg/dl.

## 2021-10-12 NOTE — Progress Notes (Addendum)
Same day note  Patient seen and examined at bedside.  Patient was admitted to the hospital for abdominal pain nausea vomiting.  At the time of my evaluation, patient complains of abdominal pain.  Physical examination reveals elderly male with generalized abdominal tenderness.  NG tube in place.  Laboratory data and imaging was reviewed  Assessment and Plan.  SBO (small bowel obstruction) (HCC) Continue n.p.o. IV fluids, follow general surgery.  Antiemetics, analgesics conservative treatment.  Currently on NG tube with intermittent suctioning.  Surgery following.  Acute lower UTI Continue IV Rocephin, and will follow urine culture and sensitivity.   Dyslipidemia On statin therapy and Lovaza.   BPH (benign prostatic hyperplasia) Continue Flomax.   Essential hypertension Hold oral antihypertensive.  Add metoprolol IV and IV hydralazine.   GERD without esophagitis Continue H2 blocker therapy.   DM type 2 (diabetes mellitus, type 2) (HCC) Continue sliding scale insulin.  No Charge  Signed,  Matthew Pereyra, MD [facility]

## 2021-10-12 NOTE — ED Notes (Signed)
Pt has pulled out his NG.

## 2021-10-12 NOTE — Assessment & Plan Note (Addendum)
Add pantoprazole.

## 2021-10-12 NOTE — Consult Note (Signed)
[facility] SURGICAL CONSULTATION NOTE (initial) - cpt: 23557   HISTORY OF PRESENT ILLNESS (HPI):  88 y.o. male presented to [facility] ED overnight for evaluation of abdominal, nausea, emesis, and diarrhea. Patient comes via EMS from [facility] assisted living. He does have a history of dementia and is unable to provide any additional history. No family at bedside.On chart review, he does have history of exploratory laparotomy in 1994. Work up in the ED revealed normal WBC at 6.7K, Hgb normal at 11.4, renal function in normal ranges with sCr - 1.18. CT Abdomen/Pelvis was obtained and concerning for dilated stomach and small bowel with gradual decompression of distal small bowel concerning for obstruction. NGT was placed and he was admitted to the medicine service.   Surgery is consulted by hospitalist physician Dr. Eugenie Norrie, MD in this context for evaluation and management of SBO.   PAST MEDICAL HISTORY (PMH):  Past Medical History:  Diagnosis Date   Anemia    BPH (benign prostatic hyperplasia)    Diabetes mellitus without complication ([place])    Edema    Hypertension    Hypoglycemia    Renal insufficiency    Urinary retention      PAST SURGICAL HISTORY ([place]):  Past Surgical History:  Procedure Laterality Date   unknown       MEDICATIONS:  Prior to Admission medications   Medication Sig Start Date End Date Taking? Authorizing Provider  acetaminophen (TYLENOL) 325 MG tablet Take 650 mg by mouth every 6 (six) hours as needed for mild pain or moderate pain.   Yes [provider]  docusate sodium (COLACE) 100 MG capsule Take 100 mg by mouth daily.   Yes [provider]  ferrous sulfate 325 (65 FE) MG tablet Take 325 mg by mouth daily.   Yes [provider]  fluorometholone (FML) 0.1 % ophthalmic suspension Place 1 drop into both eyes daily.   Yes [provider]  levocetirizine (XYZAL) 5 MG tablet Take 5 mg by mouth daily.    Yes [provider]  losartan (COZAAR) 50 MG tablet Take 50 mg by mouth daily.    Yes [provider]  Omega-3 Fatty Acids (FISH OIL) 1000 MG CAPS Take 1,000 mg by mouth 2 (two) times daily.   Yes [provider]  tamsulosin (FLOMAX) 0.4 MG CAPS capsule Take 0.4 mg by mouth daily.    Yes [provider]  atorvastatin (LIPITOR) 10 MG tablet Take 10 mg by mouth at bedtime.    [provider]  Cholecalciferol 1.25 MG (50000 UT) capsule Take 1 capsule by mouth once a week.    [provider]  famotidine (PEPCID) 20 MG tablet Take 20 mg by mouth at bedtime. 09/13/21   [provider]  lidocaine (LIDODERM) 5 % Place 1 patch onto the skin daily. Remove & Discard patch within 12 hours or as directed by MD Patient not taking: Reported on 10/12/2021 05/08/19   Sharen Hones, MD  metoprolol succinate (TOPROL-XL) 100 MG 24 hr tablet Take 100 mg by mouth daily.     [provider]  metoprolol succinate (TOPROL-XL) 50 MG 24 hr tablet Take 50 mg by mouth daily. 09/13/21   [provider]  oxyCODONE (OXY IR/ROXICODONE) 5 MG immediate release tablet Take 1 tablet (5 mg total) by mouth every 8 (eight) hours as needed for moderate pain. Patient not taking: Reported on 10/12/2021 05/08/19   Sharen Hones, MD  Vitamin D, Ergocalciferol, (DRISDOL) 1.25 MG (50000  UNIT) CAPS capsule Take 50,000 Units by mouth every Wednesday.    [provider]     ALLERGIES:  No Known Allergies   SOCIAL HISTORY:  Social History   Socioeconomic History   Marital status: Widowed    Spouse name: Not on file   Number of children: Not on file   Years of education: Not on file   Highest education level: Not on file  Occupational History   Not on file  Tobacco Use   Smoking status: Never   Smokeless tobacco: Never  Substance and Sexual Activity   Alcohol use: Not Currently   Drug use: Not on file   Sexual activity: Not on file  Other Topics Concern   Not on  file  Social History Narrative   Not on file   Social Determinants of Health   Financial Resource Strain: Not on file  Food Insecurity: Not on file  Transportation Needs: Not on file  Physical Activity: Not on file  Stress: Not on file  Social Connections: Not on file  Intimate Partner Violence: Not on file     FAMILY HISTORY:  Family History  Family history unknown: Yes      REVIEW OF SYSTEMS:  Review of Systems  Unable to perform ROS: Dementia    VITAL SIGNS:  Temp:  [97.4 F (36.3 C)-98.3 F (36.8 C)] 98.3 F (36.8 C) (10/11 0600) Pulse Rate:  [66-80] 70 (10/11 0600) Resp:  [18-20] 20 (10/11 0600) BP: (135-176)/(86-103) 176/94 (10/11 0600) SpO2:  [97 %-100 %] 98 % (10/11 0600) Weight:  [83.9 kg] 83.9 kg (10/10 2141)     Height: 6\' 2"  (188 cm) Weight: 83.9 kg BMI (Calculated): 23.74   INTAKE/OUTPUT:  No intake/output data recorded.  PHYSICAL EXAM:  Physical Exam Vitals and nursing note reviewed.  Constitutional:      General: He is not in acute distress.    Appearance: Normal appearance.     Comments: Patient asleep, does not arouse, NAD  HENT:     Head: Normocephalic and atraumatic.     Comments: NGT in place Pulmonary:     Effort: Pulmonary effort is normal. No respiratory distress.  Abdominal:     General: Abdomen is flat. A surgical scar is present. There is no distension.     Palpations: Abdomen is soft.     Tenderness: There is no abdominal tenderness.     Comments: Abdomen appears soft, he does not arouse or react to palpation. Of course dementia does not make examination reliable. Non-distended. Midline scar appreciated  Genitourinary:    Comments: Deferred Neurological:     Mental Status: He is alert.     Comments: Sleeping; does not arouse. Unable to reliably assess  Psychiatric:     Comments: Sleeping; does not arouse. Unable to reliably assess      Labs:     Latest Ref Rng & Units 10/12/2021    6:10 AM 10/11/2021    9:43 PM  05/08/2019    4:08 AM  CBC  WBC 4.0 - 10.5 K/uL 5.5  6.7  5.9   Hemoglobin 13.0 - 17.0 g/dL 07/08/2019  41.3  24.4   Hematocrit 39.0 - 52.0 % 34.5  36.9  38.5   Platelets 150 - 400 K/uL 142  152  157       Latest Ref Rng & Units 10/12/2021    6:10 AM 10/11/2021    9:43 PM 05/08/2019    4:08 AM  CMP  Glucose 70 -  99 mg/dL 024  097  90   BUN 8 - 23 mg/dL 20  20  23    Creatinine 0.61 - 1.24 mg/dL  3.53  2.99   Sodium 135 - 145 mmol/L 144  144  140   Potassium 3.5 - 5.1 mmol/L 4.3  4.2  3.8   Chloride 98 - 111 mmol/L 113  112  107   CO2 22 - 32 mmol/L 24  25  26    Calcium 8.9 - 10.3 mg/dL 9.1  9.6  9.2   Total Protein 6.5 - 8.1 g/dL  7.0    Total Bilirubin 0.3 - 1.2 mg/dL  0.5    Alkaline Phos 38 - 126 U/L  52    AST 15 - 41 U/L  25    ALT 0 - 44 U/L  17       Imaging studies:  CT Abdomen/Pelvis (10/11/2021) personally reviewed which shows dilated stomach and small bowel concerning for possible SBO, and radiologist report reviewed below:  IMPRESSION: 1. Fluid distended left abdominal small bowel with decompression of distal small bowel likely representing obstruction. 2. Aortic atherosclerosis. 3. Prominent bladder wall thickening likely indicating cystitis. Correlate with urinalysis.    Assessment/Plan: (ICD-10's: K32.609) 88 y.o. male with abdominal pain, nausea, emesis, and diarrhea found to have small bowel dilation concerning for SBO, which is certainly possible given major intra-abdominal surgery but enteritis may also be potential etiology given diarrhea, complicated by dementia.    - Appreciate medicine admission - NGT is reasonable as tolerated; LIS; monitor and record output  - Monitor abdominal examination; on-going bowel function  - Recommend serial KUBs as examination will not be reliable given dementia  - No emergent surgical intervention; he is a very sub-optimal candidate for any intervention  - Pain control prn; antiemetics prn  - Mobilize as feasible -  Further management per primary service; we will follow    All of the above findings and recommendations were discussed with the medical team. No family present  Thank you for the opportunity to participate in this patient's care.   -- K54.20, PA-C [facility] 10/12/2021, 7:30 AM M-F: 7am - 4pm

## 2021-10-12 NOTE — ED Provider Notes (Addendum)
EKG interpreted by me at 2023 Heart rate 80 QRS 80 QTc 480 Normal sinus rhythm, occasional PVC.  There is to have low amplitude P waves present.  T wave inversions noted in inferior and lateral precordial distribution.  Morphology similar to previous EKG from May 06, 2019   Delman Kitten, MD 10/12/21 2134    Delman Kitten, MD 10/12/21 2135

## 2021-10-12 NOTE — ED Notes (Signed)
Full linen change & peri-care provided for patient. Condom cath placed on patient at this time

## 2021-10-12 NOTE — Assessment & Plan Note (Addendum)
Continue antibiotic therapy with ceftriaxone Patient has been afebrile.

## 2021-10-12 NOTE — Assessment & Plan Note (Addendum)
Systolic blood pressure has been 150 to 170 mmHg, plan to resume losartan 50 mg daily. Change IV metoprolol for oral metoprolol (low dose).

## 2021-10-13 ENCOUNTER — Inpatient Hospital Stay: Payer: Medicare Other

## 2021-10-13 DIAGNOSIS — K56609 Unspecified intestinal obstruction, unspecified as to partial versus complete obstruction: Secondary | ICD-10-CM | POA: Diagnosis not present

## 2021-10-13 LAB — BASIC METABOLIC PANEL
Anion gap: 7 (ref 5–15)
BUN: 19 mg/dL (ref 8–23)
CO2: 25 mmol/L (ref 22–32)
Calcium: 9.1 mg/dL (ref 8.9–10.3)
Chloride: 112 mmol/L — ABNORMAL HIGH (ref 98–111)
Creatinine, Ser: 1.19 mg/dL (ref 0.61–1.24)
GFR, Estimated: 55 mL/min — ABNORMAL LOW (ref >60)
Glucose, Bld: 89 mg/dL (ref 70–99)
Potassium: 3.7 mmol/L (ref 3.5–5.1)
Sodium: 144 mmol/L (ref 135–145)

## 2021-10-13 LAB — CBC
HCT: 36.4 % — ABNORMAL LOW (ref 39.0–52.0)
Hemoglobin: 11.2 g/dL — ABNORMAL LOW (ref 13.0–17.0)
MCH: 28.9 pg (ref 26.0–34.0)
MCHC: 30.8 g/dL (ref 30.0–36.0)
MCV: 93.8 fL (ref 80.0–100.0)
Platelets: 159 10*3/uL (ref 150–400)
RBC: 3.88 MIL/uL — ABNORMAL LOW (ref 4.22–5.81)
RDW: 13.8 % (ref 11.5–15.5)
WBC: 4.7 10*3/uL (ref 4.0–10.5)
nRBC: 0 % (ref 0.0–0.2)

## 2021-10-13 LAB — GLUCOSE, CAPILLARY
Glucose-Capillary: 50 mg/dL — ABNORMAL LOW (ref 70–99)
Glucose-Capillary: 88 mg/dL (ref 70–99)
Glucose-Capillary: 73 mg/dL (ref 70–99)

## 2021-10-13 LAB — URINE CULTURE

## 2021-10-13 LAB — MAGNESIUM: Magnesium: 2.1 mg/dL (ref 1.7–2.4)

## 2021-10-13 LAB — CBG MONITORING, ED: Glucose-Capillary: 83 mg/dL (ref 70–99)

## 2021-10-13 MED ORDER — DEXTROSE-NACL 5-0.9 % IV SOLN: INTRAVENOUS | Status: DC

## 2021-10-13 MED ORDER — DEXTROSE 50 % IV SOLN
25.0000 g | INTRAVENOUS | Status: AC
  Administered 2021-10-13: 25 g via INTRAVENOUS
  Filled 2021-10-13: qty 50

## 2021-10-13 MED ORDER — INSULIN ASPART 100 UNIT/ML IJ SOLN: 0.0000 [IU] | Freq: Four times a day (QID) | INTRAMUSCULAR | Status: DC

## 2021-10-13 NOTE — ED Notes (Signed)
Pt used urinal and oral care done.

## 2021-10-13 NOTE — Hospital Course (Addendum)
Matthew Goodwin was admitted to the hospital with the working diagnosis of small bowel obstruction.   33 yom w/ T2DM,BPH,dementia and hypertension, present with acute abdominal pain, nausea and vomiting as well as diarrhea. No fevers. EMS was called and patient was transported to the ED. On his initial physical examination his blood pressure was 135/56, 159/103, HR 66, RR 20 and 02 saturation 98%, lungs with no wheezing, heart with S1 and S2 present and rhythmic, abdomen soft, non distended, tender to palpation with no rebound, no lower extremity edema.   Na 144, K 4,2 Cl 112, bicarbonate 25, glucose 157, bun 20 cr 1,18  Wbc 6,7 hgb 11,4 plt 152  Urine analysis with SG >1.046, negative protein, wbc 6-10   EKG 88 bpm, left axis deviation, normal intervals, sinus rhythm with PAC, no significant ST segment or T wave changes.   CT abdomen showed distended left abdominal small bowel with decompression of distal small bowel likely representing obstruction. Badder wall thickening indicating cystitis.  Patient was admitted, general surgery consulted, felt he is not a operative candidate being managed conservatively, palliative care was consulted given overall frail situation. NG tube got accidentally pulled out 10/14 a.m., reevaluated by surgery> patient had large BM in the evening, small bowel follow-through 8 hours postcontrast showed contrast in the decompressed large bowel with gaseous distention of small bowel finding concerning for partial small bowel obstruction, patient was continued on diet by surgery

## 2021-10-13 NOTE — Progress Notes (Signed)
PROGRESS NOTE    Matthew Goodwin  [id number]:[id number] DOB: 02/20/1920 DOA: 10/12/2021 PCP: Casilda Carls, MD    Brief Narrative:  Matthew Goodwin is a 88 years old male with medical history significant for type 2 diabetes mellitus, BPH, dementia and hypertension, who presented to the emergency room with acute onset of genera present hospital with acute onset of generalized abdominal pain nausea vomiting at his assisted living facility.  Patient was a poor historian.  In the ED, hemoglobin was 11.4.  Creatinine of 1.1.  Patient had stable vitals.  Urinalysis showed 6-10 white cells.  Urine culture was sent.   EKG showed sinus rhythm.  Patient received NG tube after CT scan showed fluid distended left abdominal small bowel with decompression of distal small bowel likely representing obstruction.  Bladder wall thickening indicating cystitis.  The patient was given 50 mcg of IV fentanyl, 4 mg of IV Zofran and 125 mL/h IV normal saline infusion and was admitted hospital for further evaluation and treatment.  Assessment and Plan.   SBO (small bowel obstruction) (HCC) Continue n.p.o. IV fluids, general surgery following.  Has NG tube for intermittent suctioning.  Continue supportive care including analgesics and antiemetics.  Continue serial KUB.  History of dementia continue supportive care.  Acute lower UTI Continue IV Rocephin, urine culture shows multiple species likely contamination.   Dyslipidemia On statin therapy and Lovaza at home.  We will hold for now.Marland Kitchen   BPH (benign prostatic hyperplasia) Continue Flomax.   Essential hypertension Hold oral antihypertensives.  Continue metoprolol IV and IV hydralazine.   GERD without esophagitis Continue H2 blocker therapy.   DM type 2 (diabetes mellitus, type 2) (HCC) Continue sliding scale insulin.      DVT prophylaxis: enoxaparin (LOVENOX) injection 40 mg Start: 10/12/21 1000   Code Status:     Code Status: Full Code  Disposition: Assisted  living facility/group home.  Status is: Inpatient  Remains inpatient appropriate because: Bowel obstruction, IV fluids, n.p.o. status   Family Communication:   I tried to reach Ms Allene Pyo but was unable to reach her.   Consultants:  General surgery  Procedures:  NG tube placement  Antimicrobials:  Rocephin IV 10/12/2021>  Anti-infectives (From admission, onward)    Start     Dose/Rate Route Frequency Ordered Stop   10/12/21 0645  cefTRIAXone (ROCEPHIN) 1 g in sodium chloride 0.9 % 100 mL IVPB        1 g 200 mL/hr over 30 Minutes Intravenous Every 24 hours 10/12/21 0636        Subjective: Today, patient was seen and examined at bedside.Poor historian. Denies nausea vomiting. Had BM as per chart. On NG tube.   Objective: Vitals:   10/13/21 0018 10/13/21 0230 10/13/21 0458 10/13/21 0600  BP: (!) 172/96 (!) 138/92  126/88  Pulse: (!) 49 64  79  Resp: 18 20  (!) 25  Temp: 98.1 F (36.7 C)  97.8 F (36.6 C)   TempSrc: Axillary  Axillary   SpO2: 100% 100%  100%  Weight:      Height:        Intake/Output Summary (Last 24 hours) at 10/13/2021 0913 Last data filed at 10/13/2021 0001 Gross per 24 hour  Intake --  Output 500 ml  Net -500 ml   Filed Weights   10/11/21 2141  Weight: 83.9 kg    Physical Examination: Body mass index is 23.75 kg/m.  General:  Average built, not in obvious distress, NG tube in place HENT:  No scleral pallor or icterus noted. Oral mucosa is moist.  Chest:  Clear breath sounds.  Diminished breath sounds bilaterally. No crackles or wheezes.  CVS: S1 &S2 heard. No murmur.  Regular rate and rhythm. Abdomen: soft, non tender, bowel sounds  Extremities: No cyanosis, clubbing or edema.  Peripheral pulses are palpable. Psych: Alert, awake and oriented, normal mood CNS:  No cranial nerve deficits.  Power equal in all extremities.   Skin: Warm and dry.  No rashes noted.  Data Reviewed:   CBC: Recent Labs  Lab 10/11/21 2143  10/12/21 0610 10/13/21 0454  WBC 6.7 5.5 4.7  HGB 11.4* 10.9* 11.2*  HCT 36.9* 34.5* 36.4*  MCV 95.1 92.2 93.8  PLT 152 142* 159    Basic Metabolic Panel: Recent Labs  Lab 10/11/21 2143 10/12/21 0610 10/13/21 0454  NA 144 144 144  K 4.2 4.3 3.7  CL 112* 113* 112*  CO2 25 24 25   GLUCOSE 157* 121* 89  BUN 20 20 19   CREATININE 1.18 1.15 1.19  CALCIUM 9.6 9.1 9.1  MG  --   --  2.1    Liver Function Tests: Recent Labs  Lab 10/11/21 2143  AST 25  ALT 17  ALKPHOS 52  BILITOT 0.5  PROT 7.0  ALBUMIN 3.9     Radiology Studies: DG Abdomen 1 View  Result Date: 10/13/2021 CLINICAL DATA:  NG tube placement EXAM: ABDOMEN - 1 VIEW COMPARISON:  10/12/2021 FINDINGS: The enteric tube has pulled back or been repositioned. Tip now projects over the expected location of the EG junction and advancement is suggested. Persistent gas distended small and large bowel. IMPRESSION: Enteric tube tip projects over the expected location of the EG junction. Advancement is suggested. Persistent gaseous distention of small and large bowel. Electronically Signed   By: 12/13/2021 M.D.   On: 10/13/2021 00:43   DG Abd 2 Views  Result Date: 10/12/2021 CLINICAL DATA:  Small bowel obstruction EXAM: ABDOMEN - 2 VIEW COMPARISON:  10/12/2021, 3:27 a.m. FINDINGS: Diffusely distended small bowel and colon throughout the abdomen and pelvis, largest loops of small bowel measuring up to 5.1 cm in caliber. Gas appears to be present to at least the distal colon. Esophagogastric tube with tip and side port below the diaphragm. No free air in the abdomen. Excreted contrast in the renal collecting systems and bladder. IMPRESSION: 1. Diffusely distended small bowel and colon throughout the abdomen and pelvis, largest loops of small bowel measuring up to 5.1 cm in caliber. Gas appears to be present to at least the distal colon. Findings are consistent with small bowel obstruction or ileus. 2.  No free air in the  abdomen. 3.  Esophagogastric tube with tip and side port below the diaphragm. Electronically Signed   By: 12/12/2021 M.D.   On: 10/12/2021 10:14   DG Abd Portable 1 View  Result Date: 10/12/2021 CLINICAL DATA:  Nasogastric tube placement EXAM: PORTABLE ABDOMEN - 1 VIEW COMPARISON:  None Available. FINDINGS: Nasogastric tube tip and side port project within the stomach. Lung bases are clear. Dilated gas-filled bowel in the upper abdomen. Excreted contrast within the renal collecting systems. IMPRESSION: Nasogastric tube tip and side port project within the stomach. Electronically Signed   By: 12/12/2021 M.D.   On: 10/12/2021 03:46   CT Abdomen Pelvis W Contrast  Result Date: 10/12/2021 CLINICAL DATA:  Nausea, vomiting, and diarrhea for 1 day. EXAM: CT ABDOMEN AND PELVIS WITH CONTRAST TECHNIQUE: Multidetector CT imaging of the  abdomen and pelvis was performed using the standard protocol following bolus administration of intravenous contrast. RADIATION DOSE REDUCTION: This exam was performed according to the departmental dose-optimization program which includes automated exposure control, adjustment of the mA and/or kV according to patient size and/or use of iterative reconstruction technique. CONTRAST:  OMNIPAQUE IOHEXOL 300 MG/ML  SOLN COMPARISON:  05/06/2019 FINDINGS: Lower chest: Atelectasis in the lung bases.  Cardiac enlargement. Hepatobiliary: No focal liver abnormality is seen. No gallstones, gallbladder wall thickening, or biliary dilatation. Pancreas: Unremarkable. No pancreatic ductal dilatation or surrounding inflammatory changes. Spleen: Calcified granulomas Adrenals/Urinary Tract: No adrenal gland nodules. 2 cm cyst in the right kidney. No imaging follow-up is indicated. Kidneys are otherwise unremarkable. Diffuse bladder wall thickening may indicate cystitis. Stomach/Bowel: Stomach is not abnormally distended. Prominent fluid distention of left upper quadrant and left pelvic small  bowel with decompression of the distal small bowel. Changes likely represent obstruction. Colon is decompressed. Appendix is normal. Vascular/Lymphatic: Aortic atherosclerosis. No enlarged abdominal or pelvic lymph nodes. Reproductive: Prostate gland is not enlarged. Other: No free air or free fluid in the abdomen. Midline sutures consistent with previous surgery. Abdominal wall musculature appears intact. Musculoskeletal: Degenerative changes. Metallic foreign body adjacent to the right acetabulum. Old compression of L1 with increased loss of vertebral height since previous study. IMPRESSION: 1. Fluid distended left abdominal small bowel with decompression of distal small bowel likely representing obstruction. 2. Aortic atherosclerosis. 3. Prominent bladder wall thickening likely indicating cystitis. Correlate with urinalysis. Electronically Signed   By: Burman Nieves M.D.   On: 10/12/2021 00:33      LOS: 1 day    Joycelyn Das, MD [facility] Available via Epic secure chat 7am-7pm After these hours, please refer to coverage provider listed on [email or web address] 10/13/2021, 9:13 AM

## 2021-10-13 NOTE — ED Notes (Signed)
NG replaced.

## 2021-10-13 NOTE — ED Notes (Signed)
NG tube advanced  

## 2021-10-13 NOTE — Progress Notes (Signed)
[facility] SURGICAL PROGRESS NOTE (cpt [phone])  Hospital Day(s): 1.   Interval History:  Patient seen and examined,  Overnight, pulled NGT but was replaced Patient resting comfortably this morning; he is more alert Does not complain of abdominal pain Labs this morning are reassuring NGT output not recorded  KUB this morning continues to show gaseous distension of large and small bowel He did have a bowel movement overnight per chart review  Review of Systems:  Unable to reliably preform secondary to dementia   Vital signs in last 24 hours: [min-max] current  Temp:  [97.8 F (36.6 C)-98.1 F (36.7 C)] 97.8 F (36.6 C) (10/12 0458) Pulse Rate:  [49-79] 79 (10/12 0600) Resp:  [18-25] 25 (10/12 0600) BP: (126-172)/(86-98) 126/88 (10/12 0600) SpO2:  [97 %-100 %] 100 % (10/12 0600)     Height: 6\' 2"  (188 cm) Weight: 83.9 kg BMI (Calculated): 23.74   Intake/Output last 2 shifts:  10/11 0701 - 10/12 0700 In: -  Out: 500    Physical Exam:  Constitutional: More alert, responds to verbal stimuli, NAD HENT: normocephalic without obvious abnormality; NGT in place; no output Eyes: PERRL, EOM's grossly intact and symmetric  Respiratory: breathing non-labored at rest  Cardiovascular: regular rate and sinus rhythm  Gastrointestinal: soft, he does not appear tender, no significant distension, no rebound/guarding. He is certainly not peritonitic   Labs:     Latest Ref Rng & Units 10/13/2021    4:54 AM 10/12/2021    6:10 AM 10/11/2021    9:43 PM  CBC  WBC 4.0 - 10.5 K/uL 4.7  5.5  6.7   Hemoglobin 13.0 - 17.0 g/dL 12/11/2021  87.5  64.3   Hematocrit 39.0 - 52.0 % 36.4  34.5  36.9   Platelets 150 - 400 K/uL 159  142  152       Latest Ref Rng & Units 10/13/2021    4:54 AM 10/12/2021    6:10 AM 10/11/2021    9:43 PM  CMP  Glucose 70 - 99 mg/dL 89  12/11/2021  518   BUN 8 - 23 mg/dL 19  20  20    Creatinine 0.61 - 1.24 mg/dL 841   6.60   Sodium 135 - 145 mmol/L 144   144  144   Potassium 3.5 - 5.1 mmol/L 3.7  4.3  4.2   Chloride 98 - 111 mmol/L 112  113  112   CO2 22 - 32 mmol/L 25  24  25    Calcium 8.9 - 10.3 mg/dL 9.1  9.1  9.6   Total Protein 6.5 - 8.1 g/dL   7.0   Total Bilirubin 0.3 - 1.2 mg/dL   0.5   Alkaline Phos 38 - 126 U/L   52   AST 15 - 41 U/L   25   ALT 0 - 44 U/L   17      Imaging studies:   KUB (10/13/2021) personally reviewed, NGT position noted and NGT was advanced overnight, still with bowel distension, and radiologist report reviewed below:  IMPRESSION: Enteric tube tip projects over the expected location of the EG junction. Advancement is suggested. Persistent gaseous distention of small and large bowel.   Assessment/Plan: (ICD-10's: K29.609) 88 y.o. male with small bowel dilation concerning for SBO, which is certainly possible given major intra-abdominal surgery but enteritis may also be potential etiology given diarrhea, ileus also possible, complicated by dementia.    - Continue NGT for now; LIS; monitor and record output  -  No emergent surgical intervention at this time. He is certainly a sub-optimal candidate for surgery. Agree with involving palliative care to outline [place]  - Monitor abdominal examination; on-going bowel function             - Recommend serial KUBs as examination will not be reliable given dementia  -  Pain control prn; antiemetics prn             - Mobilize as feasible - Further management per primary service; we will follow     All of the above findings and recommendations were discussed with the medical team.   -- Edison Simon, PA-C [facility] 10/13/2021, 7:36 AM M-F: 7am - 4pm

## 2021-10-13 NOTE — ED Notes (Signed)
Report to alex horton, rn.

## 2021-10-14 ENCOUNTER — Inpatient Hospital Stay: Payer: Medicare Other

## 2021-10-14 DIAGNOSIS — E1122 Type 2 diabetes mellitus with diabetic chronic kidney disease: Secondary | ICD-10-CM | POA: Diagnosis not present

## 2021-10-14 DIAGNOSIS — E876 Hypokalemia: Secondary | ICD-10-CM

## 2021-10-14 DIAGNOSIS — Z515 Encounter for palliative care: Secondary | ICD-10-CM

## 2021-10-14 DIAGNOSIS — K56609 Unspecified intestinal obstruction, unspecified as to partial versus complete obstruction: Secondary | ICD-10-CM | POA: Diagnosis not present

## 2021-10-14 DIAGNOSIS — N400 Enlarged prostate without lower urinary tract symptoms: Secondary | ICD-10-CM | POA: Diagnosis not present

## 2021-10-14 DIAGNOSIS — N390 Urinary tract infection, site not specified: Secondary | ICD-10-CM | POA: Diagnosis not present

## 2021-10-14 LAB — GLUCOSE, CAPILLARY
Glucose-Capillary: 100 mg/dL — ABNORMAL HIGH (ref 70–99)
Glucose-Capillary: 105 mg/dL — ABNORMAL HIGH (ref 70–99)
Glucose-Capillary: 109 mg/dL — ABNORMAL HIGH (ref 70–99)
Glucose-Capillary: 128 mg/dL — ABNORMAL HIGH (ref 70–99)
Glucose-Capillary: 85 mg/dL (ref 70–99)

## 2021-10-14 LAB — CBC
HCT: 38.8 % — ABNORMAL LOW (ref 39.0–52.0)
Hemoglobin: 12.6 g/dL — ABNORMAL LOW (ref 13.0–17.0)
MCH: 29.4 pg (ref 26.0–34.0)
MCHC: 32.5 g/dL (ref 30.0–36.0)
MCV: 90.7 fL (ref 80.0–100.0)
Platelets: 167 10*3/uL (ref 150–400)
RBC: 4.28 MIL/uL (ref 4.22–5.81)
RDW: 13.2 % (ref 11.5–15.5)
WBC: 5.2 10*3/uL (ref 4.0–10.5)
nRBC: 0 % (ref 0.0–0.2)

## 2021-10-14 LAB — BASIC METABOLIC PANEL
Anion gap: 10 (ref 5–15)
BUN: 14 mg/dL (ref 8–23)
CO2: 24 mmol/L (ref 22–32)
Calcium: 9.3 mg/dL (ref 8.9–10.3)
Chloride: 109 mmol/L (ref 98–111)
Creatinine, Ser: 0.9 mg/dL (ref 0.61–1.24)
GFR, Estimated: >60 mL/min (ref >60)
Glucose, Bld: 105 mg/dL — ABNORMAL HIGH (ref 70–99)
Potassium: 3.1 mmol/L — ABNORMAL LOW (ref 3.5–5.1)
Sodium: 143 mmol/L (ref 135–145)

## 2021-10-14 LAB — MAGNESIUM: Magnesium: 1.9 mg/dL (ref 1.7–2.4)

## 2021-10-14 MED ORDER — POTASSIUM CHLORIDE 10 MEQ/100ML IV SOLN
10.0000 meq | INTRAVENOUS | Status: AC
  Administered 2021-10-14 (×6): 10 meq via INTRAVENOUS
  Filled 2021-10-14 (×7): qty 100

## 2021-10-14 MED ORDER — INSULIN ASPART 100 UNIT/ML IJ SOLN: 0.0000 [IU] | Freq: Three times a day (TID) | INTRAMUSCULAR | Status: DC
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
## 2021-10-14 NOTE — Progress Notes (Signed)
Palliative: Thank you for this consult. Unfortunately due to high volume of consults there will be a delay in a Palliative Provider seeing this patient. Palliative Medicine will return to service on 10/17/2024 and will see patient at that time. Conference with team as to above.  No charge Greyson Peavy, NP Palliative Medicine Please call Palliative Medicine team phone with any questions [phone]. For individual providers please see AMION. 
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
## 2021-10-14 NOTE — TOC Initial Note (Signed)
Transition of Care [facility]) - Initial/Assessment Note    Patient Details  Name: Matthew Goodwin MRN: [id number] Date of Birth: 1920-10-03  Transition of Care [facility]) CM/SW Contact:    Conception Oms, RN Phone Number: 10/14/2021, 2:49 PM  Clinical Narrative:                  Kennon Holter Years, the supervisors number is [phone] Rossie Muskrat called Shirlee Limerick and got a message that the  VM is full , unable to leave a VM at this time, will try to call Armandina Gemma years again a call Attempted to reach Lucy Antigua  at [phone], the number does not belong to [facility] but belongs to Bobbye Morton Will try to reach the supervisor at [place] years again  Patient Goals and CMS Choice        Expected Discharge Plan and Services                                                Prior Living Arrangements/Services                       Activities of Daily Living Home Assistive Devices/Equipment: Gilford Rile (specify type) ADL Screening (condition at time of admission) Patient's cognitive ability adequate to safely complete daily activities?: No Is the patient deaf or have difficulty hearing?: No Does the patient have difficulty seeing, even when wearing glasses/contacts?: No Does the patient have difficulty concentrating, remembering, or making decisions?: No Patient able to express need for assistance with ADLs?: Yes Does the patient have difficulty dressing or bathing?: No Independently performs ADLs?: No Communication: Independent Dressing (OT): Needs assistance Is this a change from baseline?: Pre-admission baseline Grooming: Needs assistance Is this a change from baseline?: Pre-admission baseline Feeding: Independent Bathing: Needs assistance Is this a change from baseline?: Pre-admission baseline Toileting: Needs assistance Is this a change from baseline?: Pre-admission baseline In/Out Bed: Needs assistance Is this a change from baseline?: Pre-admission baseline Walks in  Home: Independent with device (comment) Does the patient have difficulty walking or climbing stairs?: Yes Weakness of Legs: Both Weakness of Arms/Hands: None  Permission Sought/Granted                  Emotional Assessment              Admission diagnosis:  SBO (small bowel obstruction) ([place]) [K56.609] Patient Active Problem List   Diagnosis Date Noted   SBO (small bowel obstruction) ([place]) 10/12/2021   GERD without esophagitis 10/12/2021   Essential hypertension 10/12/2021   BPH (benign prostatic hyperplasia) 10/12/2021   Dyslipidemia 10/12/2021   Closed compression fracture of L2 vertebra ([place]) 05/07/2019   Back pain 05/06/2019   Fall at home, initial encounter 05/06/2019   Closed compression fracture of body of L1 vertebra ([place]) 05/06/2019   Hydroureteronephrosis 05/06/2019   Acute lower UTI 05/06/2019   DM type 2 (diabetes mellitus, type 2) ([place]) 05/06/2019   Aortic stenosis 05/06/2019   Benign essential HTN 05/06/2019   PCP:  Casilda Carls, MD Pharmacy:   [place], [place] - [address] [place] [address] Phone: [phone] Fax: [phone]     Social Determinants of Health (SDOH) Interventions    Readmission Risk Interventions     No data to display

## 2021-10-14 NOTE — Plan of Care (Signed)
  Problem: Education: Goal: Ability to describe self-care measures that may prevent or decrease complications (Diabetes Survival Skills Education) will improve Outcome: Progressing Goal: Individualized Educational Video(s) Outcome: Progressing   Problem: Coping: Goal: Ability to adjust to condition or change in health will improve Outcome: Progressing   Problem: Fluid Volume: Goal: Ability to maintain a balanced intake and output will improve Outcome: Progressing   Problem: Health Behavior/Discharge Planning: Goal: Ability to identify and utilize available resources and services will improve Outcome: Progressing Goal: Ability to manage health-related needs will improve Outcome: Progressing   Problem: Metabolic: Goal: Ability to maintain appropriate glucose levels will improve Outcome: Progressing   Problem: Nutritional: Goal: Maintenance of adequate nutrition will improve Outcome: Progressing Goal: Progress toward achieving an optimal weight will improve Outcome: Progressing   Problem: Skin Integrity: Goal: Risk for impaired skin integrity will decrease Outcome: Progressing   Problem: Tissue Perfusion: Goal: Adequacy of tissue perfusion will improve Outcome: Progressing   Problem: Education: Goal: Knowledge of General Education information will improve Description: Including pain rating scale, medication(s)/side effects and non-pharmacologic comfort measures Outcome: Progressing   Problem: Health Behavior/Discharge Planning: Goal: Ability to manage health-related needs will improve Outcome: Progressing   Problem: Clinical Measurements: Goal: Ability to maintain clinical measurements within normal limits will improve Outcome: Progressing Goal: Will remain free from infection Outcome: Progressing Goal: Diagnostic test results will improve Outcome: Progressing Goal: Respiratory complications will improve Outcome: Progressing Goal: Cardiovascular complication will  be avoided Outcome: Progressing   Problem: Activity: Goal: Risk for activity intolerance will decrease Outcome: Progressing   Problem: Nutrition: Goal: Adequate nutrition will be maintained Outcome: Progressing   Problem: Nutrition: Goal: Adequate nutrition will be maintained Outcome: Progressing   Problem: Coping: Goal: Level of anxiety will decrease Outcome: Progressing   Problem: Elimination: Goal: Will not experience complications related to bowel motility Outcome: Progressing Goal: Will not experience complications related to urinary retention Outcome: Progressing   Problem: Pain Managment: Goal: General experience of comfort will improve Outcome: Progressing   Problem: Safety: Goal: Ability to remain free from injury will improve Outcome: Progressing   Problem: Skin Integrity: Goal: Risk for impaired skin integrity will decrease Outcome: Progressing

## 2021-10-14 NOTE — Plan of Care (Signed)

## 2021-10-15 ENCOUNTER — Encounter: Payer: Self-pay | Admitting: Family Medicine

## 2021-10-15 ENCOUNTER — Inpatient Hospital Stay: Payer: Medicare Other

## 2021-10-15 DIAGNOSIS — K56609 Unspecified intestinal obstruction, unspecified as to partial versus complete obstruction: Secondary | ICD-10-CM | POA: Diagnosis not present

## 2021-10-15 LAB — GLUCOSE, CAPILLARY
Glucose-Capillary: 102 mg/dL — ABNORMAL HIGH (ref 70–99)
Glucose-Capillary: 125 mg/dL — ABNORMAL HIGH (ref 70–99)
Glucose-Capillary: 152 mg/dL — ABNORMAL HIGH (ref 70–99)
Glucose-Capillary: 75 mg/dL (ref 70–99)

## 2021-10-15 LAB — BASIC METABOLIC PANEL
Anion gap: 7 (ref 5–15)
BUN: 11 mg/dL (ref 8–23)
CO2: 21 mmol/L — ABNORMAL LOW (ref 22–32)
Calcium: 9 mg/dL (ref 8.9–10.3)
Chloride: 115 mmol/L — ABNORMAL HIGH (ref 98–111)
Creatinine, Ser: 0.92 mg/dL (ref 0.61–1.24)
GFR, Estimated: >60 mL/min (ref >60)
Glucose, Bld: 139 mg/dL — ABNORMAL HIGH (ref 70–99)
Potassium: 3.4 mmol/L — ABNORMAL LOW (ref 3.5–5.1)
Sodium: 143 mmol/L (ref 135–145)

## 2021-10-15 LAB — CBC
HCT: 36.7 % — ABNORMAL LOW (ref 39.0–52.0)
Hemoglobin: 11.9 g/dL — ABNORMAL LOW (ref 13.0–17.0)
MCH: 28.9 pg (ref 26.0–34.0)
MCHC: 32.4 g/dL (ref 30.0–36.0)
MCV: 89.1 fL (ref 80.0–100.0)
Platelets: 153 10*3/uL (ref 150–400)
RBC: 4.12 MIL/uL — ABNORMAL LOW (ref 4.22–5.81)
RDW: 13.3 % (ref 11.5–15.5)
WBC: 4.6 10*3/uL (ref 4.0–10.5)
nRBC: 0 % (ref 0.0–0.2)

## 2021-10-15 LAB — MAGNESIUM: Magnesium: 1.8 mg/dL (ref 1.7–2.4)

## 2021-10-15 MED ORDER — POTASSIUM CHLORIDE 10 MEQ/100ML IV SOLN
10.0000 meq | INTRAVENOUS | Status: AC
  Administered 2021-10-15 (×3): 10 meq via INTRAVENOUS
  Filled 2021-10-15 (×3): qty 100

## 2021-10-15 MED ORDER — KCL IN DEXTROSE-NACL 20-5-0.9 MEQ/L-%-% IV SOLN
INTRAVENOUS | Status: DC
  Filled 2021-10-15 (×5): qty 1000

## 2021-10-15 MED ORDER — DIATRIZOATE MEGLUMINE & SODIUM 66-10 % PO SOLN
90.0000 mL | Freq: Once | ORAL | Status: AC
  Administered 2021-10-15: 90 mL via ORAL

## 2021-10-15 NOTE — Progress Notes (Signed)
Subjective:  CC: Matthew Goodwin is a 88 y.o. male  Hospital stay day 3,   SBO  HPI: Floor nurse reported NG was pulled out by patient again.  Requesting reevaluation for possible persistent SBO from recent imaging study.  Sitter reports he had a large bowel movement this AM.  ROS:  General: Denies weight loss, weight gain, fatigue, fevers, chills, and night sweats. Heart: Denies chest pain, palpitations, racing heart, irregular heartbeat, leg pain or swelling, and decreased activity tolerance. Respiratory: Denies breathing difficulty, shortness of breath, wheezing, cough, and sputum. GI: Denies change in appetite, heartburn, nausea, vomiting, constipation, diarrhea, and blood in stool. GU: Denies difficulty urinating, pain with urinating, urgency, frequency, blood in urine.   Objective:   Temp:  [97.5 F (36.4 C)-98.3 F (36.8 C)] 97.5 F (36.4 C) (10/14 0832) Pulse Rate:  [51-92] 71 (10/14 0832) Resp:  [16-18] 18 (10/14 0832) BP: (150-175)/(66-102) 161/99 (10/14 0832) SpO2:  [97 %-100 %] 100 % (10/14 0832)     Height: 6\' 2"  (188 cm) Weight: 83.9 kg BMI (Calculated): 23.74   Intake/Output this shift:   Intake/Output Summary (Last 24 hours) at 10/15/2021 1206 Last data filed at 10/15/2021 0300 Gross per 24 hour  Intake 3495.65 ml  Output 200 ml  Net 3295.65 ml    Constitutional :  alert and no distress  Respiratory:  clear to auscultation bilaterally  Cardiovascular:  regular rate and rhythm  Gastrointestinal: soft, non-tender; bowel sounds normal; no masses,  no organomegaly.   Skin: Cool and moist.   Psychiatric: Normal affect, non-agitated, not confused       LABS:     Latest Ref Rng & Units 10/15/2021    6:04 AM 10/14/2021    5:45 AM 10/13/2021    4:54 AM  CMP  Glucose 70 - 99 mg/dL 139  105  89   BUN 8 - 23 mg/dL 11  14  19    Creatinine 0.61 - 1.24 mg/dL 0.92  0.90  1.19   Sodium 135 - 145 mmol/L 143  143  144   Potassium 3.5 - 5.1 mmol/L 3.4  3.1  3.7    Chloride 98 - 111 mmol/L 115  109  112   CO2 22 - 32 mmol/L 21  24  25    Calcium 8.9 - 10.3 mg/dL 9.0  9.3  9.1       Latest Ref Rng & Units 10/15/2021    6:04 AM 10/14/2021    5:45 AM 10/13/2021    4:54 AM  CBC  WBC 4.0 - 10.5 K/uL 4.6  5.2  4.7   Hemoglobin 13.0 - 17.0 g/dL 11.9  12.6  11.2   Hematocrit 39.0 - 52.0 % 36.7  38.8  36.4   Platelets 150 - 400 K/uL 153  167  159     RADS: CLINICAL DATA:  Small-bowel obstruction   EXAM: ABDOMEN - 1 VIEW   COMPARISON:  10/14/2021, 10/13/2021   FINDINGS: Previously seen enteric tube is no longer identified and may be retracted or removed. Gaseous distension of large and small bowel with progressed colonic distension in the upper abdomen. No gross free intraperitoneal air. Postsurgical changes within the abdomen.   IMPRESSION: 1. Gaseous distension of large and small bowel with progressed colonic distension in the upper abdomen. Findings are compatible with ongoing obstruction. 2. Previously seen enteric tube is no longer identified and may be retracted or removed.     Electronically Signed   By: Davina Poke D.O.  On: 10/15/2021 10:29   Assessment:   Small bowel obstruction.  Clinical exam not concerning, patient does not seem to be in acute distress although difficult to interpret due to his baseline status.  If he did indeed have a bowel movement this a.m., replacing NG tube at this point I think we will not benefit greatly.  Okay to resume clears if safe to do so from a swallow standpoint.  And see how he tolerates.  Patient still remains a poor candidate for any sort of surgical intervention given his current comorbidities, advanced age, and baseline mental status.  labs/images/medications/previous chart entries reviewed personally and relevant changes/updates noted above.
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
## 2021-10-16 ENCOUNTER — Encounter: Payer: Self-pay | Admitting: Family Medicine

## 2021-10-16 DIAGNOSIS — K56609 Unspecified intestinal obstruction, unspecified as to partial versus complete obstruction: Secondary | ICD-10-CM | POA: Diagnosis not present

## 2021-10-16 DIAGNOSIS — N400 Enlarged prostate without lower urinary tract symptoms: Secondary | ICD-10-CM | POA: Diagnosis not present

## 2021-10-16 DIAGNOSIS — E785 Hyperlipidemia, unspecified: Secondary | ICD-10-CM | POA: Diagnosis not present

## 2021-10-16 DIAGNOSIS — F03918 Unspecified dementia, unspecified severity, with other behavioral disturbance: Secondary | ICD-10-CM

## 2021-10-16 DIAGNOSIS — E876 Hypokalemia: Secondary | ICD-10-CM | POA: Diagnosis present

## 2021-10-16 DIAGNOSIS — D649 Anemia, unspecified: Secondary | ICD-10-CM | POA: Diagnosis present

## 2021-10-16 DIAGNOSIS — N390 Urinary tract infection, site not specified: Secondary | ICD-10-CM | POA: Diagnosis not present

## 2021-10-16 LAB — CBC
HCT: 37.1 % — ABNORMAL LOW (ref 39.0–52.0)
Hemoglobin: 11.9 g/dL — ABNORMAL LOW (ref 13.0–17.0)
MCH: 29.3 pg (ref 26.0–34.0)
MCHC: 32.1 g/dL (ref 30.0–36.0)
MCV: 91.4 fL (ref 80.0–100.0)
Platelets: 146 10*3/uL — ABNORMAL LOW (ref 150–400)
RBC: 4.06 MIL/uL — ABNORMAL LOW (ref 4.22–5.81)
RDW: 13.3 % (ref 11.5–15.5)
WBC: 4.5 10*3/uL (ref 4.0–10.5)
nRBC: 0 % (ref 0.0–0.2)

## 2021-10-16 LAB — BASIC METABOLIC PANEL
Anion gap: 5 (ref 5–15)
BUN: 9 mg/dL (ref 8–23)
CO2: 23 mmol/L (ref 22–32)
Calcium: 9 mg/dL (ref 8.9–10.3)
Chloride: 115 mmol/L — ABNORMAL HIGH (ref 98–111)
Creatinine, Ser: 0.96 mg/dL (ref 0.61–1.24)
GFR, Estimated: >60 mL/min (ref >60)
Glucose, Bld: 114 mg/dL — ABNORMAL HIGH (ref 70–99)
Potassium: 3.9 mmol/L (ref 3.5–5.1)
Sodium: 143 mmol/L (ref 135–145)

## 2021-10-16 LAB — GLUCOSE, CAPILLARY
Glucose-Capillary: 79 mg/dL (ref 70–99)
Glucose-Capillary: 94 mg/dL (ref 70–99)

## 2021-10-16 MED ORDER — DEXTROSE IN LACTATED RINGERS 5 % IV SOLN: INTRAVENOUS | Status: DC

## 2021-10-16 MED ORDER — METOPROLOL TARTRATE 25 MG PO TABS
25.0000 mg | ORAL_TABLET | Freq: Two times a day (BID) | ORAL | Status: DC
  Administered 2021-10-16 – 2021-10-18 (×5): 25 mg via ORAL
  Filled 2021-10-16 (×5): qty 1

## 2021-10-16 MED ORDER — PANTOPRAZOLE SODIUM 40 MG PO TBEC
40.0000 mg | DELAYED_RELEASE_TABLET | Freq: Every day | ORAL | Status: DC
  Administered 2021-10-16 – 2021-10-18 (×3): 40 mg via ORAL
  Filled 2021-10-16 (×3): qty 1

## 2021-10-16 MED ORDER — LOSARTAN POTASSIUM 50 MG PO TABS
50.0000 mg | ORAL_TABLET | Freq: Every day | ORAL | Status: DC
  Administered 2021-10-16 – 2021-10-18 (×3): 50 mg via ORAL
  Filled 2021-10-16 (×3): qty 1

## 2021-10-16 NOTE — Plan of Care (Signed)

## 2021-10-16 NOTE — Assessment & Plan Note (Signed)
Today patient with no nausea or vomiting, denies any abdominal pain. Patient has been placed on clears with good toleration.  Continue conservative care, he is not candidate for advance invasive intervention due to comorbid conditions and poor prognosis.

## 2021-10-16 NOTE — Assessment & Plan Note (Signed)
Cell count has been stable.  

## 2021-10-16 NOTE — Assessment & Plan Note (Signed)
Renal function has been stable with serum cr at 0,96, K is 3,9 and serum bicarbonate at 23. Plan to continue close monitoring on renal function.

## 2021-10-16 NOTE — Assessment & Plan Note (Signed)
Patient has been calmed this morning He has a sitter at the bedside and is on bilateral mittens Continue neuro checks per unit protocol.  Aspiration and fall precautions.

## 2021-10-16 NOTE — Progress Notes (Signed)
Progress Note   Patient: Matthew Goodwin NUU:[id number] DOB: 1920-01-30 DOA: 10/12/2021     4 DOS: the patient was seen and examined on 10/16/2021   Brief hospital course: Mr. Ignasiak was admitted to the hospital with the working diagnosis of small bowel obstruction.   89 yom w/ T2DM,BPH,dementia and hypertension, present with acute abdominal pain, nausea and vomiting as well as diarrhea. No fevers. EMS was called and patient was transported to the ED. On his initial physical examination his blood pressure was 135/56, 159/103, HR 66, RR 20 and 02 saturation 98%, lungs with no wheezing, heart with S1 and S2 present and rhythmic, abdomen soft, non distended, tender to palpation with no rebound, no lower extremity edema.   Na 144, K 4,2 Cl 112, bicarbonate 25, glucose 157, bun 20 cr 1,18  Wbc 6,7 hgb 11,4 plt 152  Urine analysis with SG >1.046, negative protein, wbc 6-10   EKG 88 bpm, left axis deviation, normal intervals, sinus rhythm with PAC, no significant ST segment or T wave changes.   CT abdomen showed distended left abdominal small bowel with decompression of distal small bowel likely representing obstruction. Badder wall thickening indicating cystitis.  Patient was admitted, general surgery consulted, felt he is not a operative candidate being managed conservatively, palliative care was consulted given overall frail situation  Assessment and Plan: * SBO (small bowel obstruction) ([place]) Today patient with no nausea or vomiting, denies any abdominal pain. Patient has been placed on clears with good toleration.  Continue conservative care, he is not candidate for advance invasive intervention due to comorbid conditions and poor prognosis.    Acute lower UTI Continue antibiotic therapy with ceftriaxone Patient has been afebrile.   BPH (benign prostatic hyperplasia) - We will continue his Flomax.  DM type 2 (diabetes mellitus, type 2) ([place]) Continue with insulin sliding scale for  glucose cover and monitoring Fasting glucose this am 114 mg/dl.   Dyslipidemia Continue with statin therapy.   Essential hypertension Systolic blood pressure has been 150 to 170 mmHg, plan to resume losartan 50 mg daily. Change IV metoprolol for oral metoprolol (low dose).    GERD without esophagitis Add pantoprazole.   Dementia with behavioral disturbance [facility]) Patient has been calmed this morning He has a sitter at the bedside and is on bilateral mittens Continue neuro checks per unit protocol.  Aspiration and fall precautions.   Chronic anemia Cell count has been stable.   Hypokalemia Renal function has been stable with serum cr at 0,96, K is 3,9 and serum bicarbonate at 23. Plan to continue close monitoring on renal function.         Subjective: Patient is calm in bed, with no nausea or vomiting, not able to give detail history due to cognitive impairment.   Physical Exam: Vitals:   10/15/21 1534 10/15/21 1550 10/16/21 0008 10/16/21 0505  BP: (!) 136/105 (!) 151/94 (!) 171/104 (!) 152/95  Pulse: 65 84 70 71  Resp: 17  16 16   Temp: 97.7 F (36.5 C)  98.1 F (36.7 C) 97.7 F (36.5 C)  TempSrc:   Oral   SpO2:  100% 100% 100%  Weight:      Height:       Neurology somnolent but easy to arouse, able to respond to simple questions ENT with mild pallor Cardiovascular with S1 and S2 present and rhythmic  Respiratory with no rales or wheezing Abdomen is not distended or tender  No lower extremity edema  Data Reviewed:  Family Communication: no family at the bedside   Disposition: Status is: Inpatient Remains inpatient appropriate because: resolving bowel obstruction   Planned Discharge Destination: Skilled nursing facility    Author: Tawni Millers, MD 10/16/2021 12:36 PM  For on call review [email or web address].

## 2021-10-17 DIAGNOSIS — Z515 Encounter for palliative care: Secondary | ICD-10-CM | POA: Diagnosis not present

## 2021-10-17 DIAGNOSIS — K56609 Unspecified intestinal obstruction, unspecified as to partial versus complete obstruction: Secondary | ICD-10-CM | POA: Diagnosis not present

## 2021-10-17 DIAGNOSIS — F03918 Unspecified dementia, unspecified severity, with other behavioral disturbance: Secondary | ICD-10-CM | POA: Diagnosis not present

## 2021-10-17 LAB — GLUCOSE, CAPILLARY
Glucose-Capillary: 69 mg/dL — ABNORMAL LOW (ref 70–99)
Glucose-Capillary: 70 mg/dL (ref 70–99)
Glucose-Capillary: 87 mg/dL (ref 70–99)
Glucose-Capillary: 84 mg/dL (ref 70–99)
Glucose-Capillary: 94 mg/dL (ref 70–99)
Glucose-Capillary: 75 mg/dL (ref 70–99)

## 2021-10-17 LAB — CBC
HCT: 33.7 % — ABNORMAL LOW (ref 39.0–52.0)
Hemoglobin: 11 g/dL — ABNORMAL LOW (ref 13.0–17.0)
MCH: 29.6 pg (ref 26.0–34.0)
MCHC: 32.6 g/dL (ref 30.0–36.0)
MCV: 90.6 fL (ref 80.0–100.0)
Platelets: 130 10*3/uL — ABNORMAL LOW (ref 150–400)
RBC: 3.72 MIL/uL — ABNORMAL LOW (ref 4.22–5.81)
RDW: 13.4 % (ref 11.5–15.5)
WBC: 4 10*3/uL (ref 4.0–10.5)
nRBC: 0 % (ref 0.0–0.2)

## 2021-10-17 LAB — BASIC METABOLIC PANEL
Anion gap: 4 — ABNORMAL LOW (ref 5–15)
BUN: 10 mg/dL (ref 8–23)
CO2: 22 mmol/L (ref 22–32)
Calcium: 8.8 mg/dL — ABNORMAL LOW (ref 8.9–10.3)
Chloride: 115 mmol/L — ABNORMAL HIGH (ref 98–111)
Creatinine, Ser: 1 mg/dL (ref 0.61–1.24)
GFR, Estimated: >60 mL/min (ref >60)
Glucose, Bld: 108 mg/dL — ABNORMAL HIGH (ref 70–99)
Potassium: 4 mmol/L (ref 3.5–5.1)
Sodium: 141 mmol/L (ref 135–145)

## 2021-10-17 NOTE — Consult Note (Signed)
Consultation Note Date: 10/17/2021   Patient Name: Matthew Goodwin  DOB: Jun 16, 1920  MRN: [id number]  Age / Sex: 88 y.o., male  PCP: Casilda Carls, MD Referring Physician: Antonieta Pert, MD  Reason for Consultation: Establishing goals of care  HPI/Patient Profile: 88 y.o. male   admitted on 10/12/2021 with past  medical history significant for type 2 diabetes mellitus, BPH, dementia and hypertension, who presented to the emergency room with acute onset of generalized abdominal tenderness with associated nausea and vomiting as well as diarrhea that started tonight at group home  facility.  Patient does not have medical decision-making capacity.   ED Course: When came to the ER, BP was 135/96 with temperature 97.4 and otherwise unremarkable vital signs.  Labs revealed unremarkable CMP and CBC showed anemia.  UA showed 6-10 WBCs with more than 1046% gravity and 6-10 RBCs with trace leukocytes.   Imaging: Abdomen portable x-ray showed NG tube tip and side port projecting within the stomach. Abdominal pelvic CT scan showed the following: 1. Fluid distended left abdominal small bowel with decompression of distal small bowel likely representing obstruction. 2. Aortic atherosclerosis. 3. Prominent bladder wall thickening likely indicating cystitis. Correlate with urinalysis.  Today is day 5 of his hospitalization.  Patient is progressing with full liquid diet, working with therapies  Treatment option decisions, advanced directive decisions and anticipatory care needs are pending.  At this point no one has been able to contact care home or family.  This nurse practitioner and transition of care continues to explore contacts.    Much coordination of care with transition of care team/Deliliah Louvet, bedside RN and attending.     SUMMARY OF RECOMMENDATIONS    Code Status/Advance Care Planning: Full  code  Remains full code and ongoing treat the treatable with all offered life prolonging measures until we can contact patient's family or decision makers to clarify goals of care.  Palliative Prophylaxis:  Aspiration, Bowel Regimen, Delirium Protocol, Frequent Pain Assessment, and Oral Care   Psycho-social/Spiritual:  Desire for further Chaplaincy support:no   Prognosis:  Unable to determine  Discharge Planning: To Be Determined      Primary Diagnoses: Present on Admission:  SBO (small bowel obstruction) ([facility])  GERD without esophagitis  Essential hypertension  Acute lower UTI  Hypokalemia  Chronic anemia  Dementia with behavioral disturbance ([place])   I have reviewed the medical record, interviewed the patient and family, and examined the patient. The following aspects are pertinent.  Past Medical History:  Diagnosis Date   Anemia    BPH (benign prostatic hyperplasia)    Diabetes mellitus without complication ([place])    Edema    Hypertension    Hypoglycemia    Renal insufficiency    Urinary retention    Social History   Socioeconomic History   Marital status: Widowed    Spouse name: Not on file   Number of children: Not on file   Years of education: Not on file   Highest education level: Not on file  Occupational History  Not on file  Tobacco Use   Smoking status: Never    Passive exposure: Never   Smokeless tobacco: Never  Vaping Use   Vaping Use: Never used  Substance and Sexual Activity   Alcohol use: Not Currently   Drug use: Never   Sexual activity: Not Currently  Other Topics Concern   Not on file  Social History Narrative   Not on file   Social Determinants of Health   Financial Resource Strain: Not on file  Food Insecurity: No Food Insecurity (10/13/2021)   Hunger Vital Sign    Worried About Running Out of Food in the Last Year: Never true    Ran Out of Food in the Last Year: Never true  Transportation Needs: No Transportation Needs  (10/13/2021)   PRAPARE - Hydrologist (Medical): No    Lack of Transportation (Non-Medical): No  Physical Activity: Not on file  Stress: Not on file  Social Connections: Not on file   Family History  Family history unknown: Yes   Scheduled Meds:  enoxaparin (LOVENOX) injection  40 mg Subcutaneous Q24H   fluorometholone  1 drop Both Eyes Daily   insulin aspart  0-6 Units Subcutaneous TID WC   lidocaine  1 patch Transdermal Q24H   loratadine  10 mg Oral Daily   losartan  50 mg Oral Daily   metoprolol tartrate  25 mg Oral BID   omega-3 acid ethyl esters  1,000 mg Oral BID   pantoprazole  40 mg Oral Daily   tamsulosin  0.4 mg Oral Daily   Continuous Infusions:  dextrose 5% lactated ringers 75 mL/hr at 10/17/21 0249   PRN Meds:.acetaminophen **OR** acetaminophen, fentaNYL (SUBLIMAZE) injection, morphine injection, ondansetron **OR** ondansetron (ZOFRAN) IV, traZODone Medications Prior to Admission:  Prior to Admission medications   Medication Sig Start Date End Date Taking? Authorizing Provider  acetaminophen (TYLENOL) 325 MG tablet Take 650 mg by mouth every 6 (six) hours as needed for mild pain or moderate pain.   Yes [provider]  docusate sodium (COLACE) 100 MG capsule Take 100 mg by mouth daily.   Yes [provider]  ferrous sulfate 325 (65 FE) MG tablet Take 325 mg by mouth daily.   Yes [provider]  fluorometholone (FML) 0.1 % ophthalmic suspension Place 1 drop into both eyes daily.   Yes [provider]  levocetirizine (XYZAL) 5 MG tablet Take 5 mg by mouth daily.    Yes [provider]  losartan (COZAAR) 50 MG tablet Take 50 mg by mouth daily.    Yes [provider]  Omega-3 Fatty Acids (FISH OIL) 1000 MG CAPS Take 1,000 mg by mouth 2 (two) times daily.   Yes [provider]  tamsulosin (FLOMAX) 0.4 MG CAPS capsule Take 0.4 mg by mouth daily.    Yes [provider]   atorvastatin (LIPITOR) 10 MG tablet Take 10 mg by mouth at bedtime.    [provider]  Cholecalciferol 1.25 MG (50000 UT) capsule Take 1 capsule by mouth once a week.    [provider]  famotidine (PEPCID) 20 MG tablet Take 20 mg by mouth at bedtime. 09/13/21   [provider]  lidocaine (LIDODERM) 5 % Place 1 patch onto the skin daily. Remove & Discard patch within 12 hours or as directed by MD Patient not taking: Reported on 10/12/2021 05/08/19   Sharen Hones, MD  metoprolol succinate (TOPROL-XL) 100 MG 24 hr tablet Take 100  mg by mouth daily.     [provider]  metoprolol succinate (TOPROL-XL) 50 MG 24 hr tablet Take 50 mg by mouth daily. 09/13/21   [provider]  oxyCODONE (OXY IR/ROXICODONE) 5 MG immediate release tablet Take 1 tablet (5 mg total) by mouth every 8 (eight) hours as needed for moderate pain. Patient not taking: Reported on 10/12/2021 05/08/19   Sharen Hones, MD  Vitamin D, Ergocalciferol, (DRISDOL) 1.25 MG (50000 UNIT) CAPS capsule Take 50,000 Units by mouth every Wednesday.    [provider]   No Known Allergies Review of Systems  Physical Exam  Vital Signs: BP (!) 141/93 (BP Location: Right Arm)   Pulse (!) 56   Temp 97.8 F (36.6 C) (Oral)   Resp 16   Ht 6\' 2"  (1.88 m)   Wt 83.9 kg   SpO2 100%   BMI 23.75 kg/m  Pain Scale: PAINAD POSS *See Group Information*: 1-Acceptable,Awake and alert Pain Score: 0-No pain   SpO2: SpO2: 100 % O2 Device:SpO2: 100 % O2 Flow Rate: .   IO: Intake/output summary:  Intake/Output Summary (Last 24 hours) at 10/17/2021 0955 Last data filed at 10/17/2021 0249 Gross per 24 hour  Intake 1110.38 ml  Output --  Net 1110.38 ml    LBM: Last BM Date : 10/16/21 Baseline Weight: Weight: 83.9 kg Most recent weight: Weight: 83.9 kg     Palliative Assessment/Data:   Discussed with Treatment team   PMT will continue to support holistically and help clarify goals of  care.  Time In: 1000 Time Out: 1115 Time Total: 75 minutes Greater than 50%  of this time was spent counseling and coordinating care related to the above assessment and plan.  Signed by: Wadie Lessen, NP   Please contact Palliative Medicine Team phone at [phone] for questions and concerns.  For individual provider: See Shea Evans

## 2021-10-17 NOTE — Evaluation (Signed)
Occupational Therapy Evaluation Patient Details Name: Matthew Goodwin MRN: [id number] DOB: 1920/04/18 Today's Date: 10/17/2021   History of Present Illness Matthew Goodwin is a 88 y.o. African-American male with medical history significant for type 2 diabetes mellitus, BPH, dementia and hypertension, who presented to the emergency room with acute onset of generalized abdominal tenderness with associated nausea and vomiting as well as diarrhea that started tonight at his assisted living facility   Clinical Impression   Patient presenting with decreased independence in self care, balance, functional mobility/transfers, endurance, and safety awareness. Pt has cognitive deficits at baseline and is a poor historian. Per chart review, pt is from an ALF and was receiving assistance for bathing, dressing, and toileting PTA. Pt currently functioning at Max A for stand pivot transfer from recliner>EOB, Max A for LB dressing, and set up-supervision for seated grooming tasks. Pt with heavy reliance on RW for UE support and posterior lean in standing, unable to self correct. Pt required multimodal cues for following simple step commands and for safe transfer techniques. Mitts donned. Pt will benefit from acute OT to increase overall independence in the areas of ADLs and functional mobility in order to safely discharge to next venue of care. Pt could benefit from [facility] following D/C to decrease falls risk, improve balance, and maximize independence in self-care within own home environment.        Recommendations for follow up therapy are one component of a multi-disciplinary discharge planning process, led by the attending physician.  Recommendations may be updated based on patient status, additional functional criteria and insurance authorization.   Follow Up Recommendations  Home health OT    Assistance Recommended at Discharge Frequent or constant Supervision/Assistance  Patient can return home with the following  A lot of help with walking and/or transfers;A lot of help with bathing/dressing/bathroom;Assistance with cooking/housework;Direct supervision/assist for financial management;Assist for transportation;Direct supervision/assist for medications management;Help with stairs or ramp for entrance    Functional Status Assessment  Patient has had a recent decline in their functional status and demonstrates the ability to make significant improvements in function in a reasonable and predictable amount of time.  Equipment Recommendations  Other (comment) (TBD)    Recommendations for Other Services       Precautions / Restrictions Precautions Precautions: Fall Restrictions Weight Bearing Restrictions: No      Mobility Bed Mobility Overal bed mobility: Needs Assistance Bed Mobility: Sit to Supine       Sit to supine: Min assist (assist for LEs)   General bed mobility comments: Max A to scoot pt up towards [facility]    Transfers Overall transfer level: Needs assistance Equipment used: Rolling walker (2 wheels) Transfers: Bed to chair/wheelchair/BSC   Stand pivot transfers: Max assist         General transfer comment: required multiple attempts to come to upright standing position, VC for hand placement, Min A for RW management      Balance Overall balance assessment: Needs assistance Sitting-balance support: No upper extremity supported, Feet supported Sitting balance-Leahy Scale: Fair   Postural control: Posterior lean Standing balance support: Bilateral upper extremity supported, During functional activity, Reliant on assistive device for balance Standing balance-Leahy Scale: Poor                             ADL either performed or assessed with clinical judgement   ADL Overall ADL's : Needs assistance/impaired     Grooming: Wash/dry face;Set up;Sitting  Lower Body Dressing: Maximal assistance;Sitting/lateral leans Lower Body Dressing Details  (indicate cue type and reason): to don/doff socks Toilet Transfer: Maximal assistance;Rolling walker (2 wheels);Stand-pivot [employer] forces technical officer Details (indicate cue type and reason): simulated with stand pivot transfer from recliner>EOB                 Vision         Perception     Praxis      Pertinent Vitals/Pain Pain Assessment Pain Assessment: No/denies pain     Hand Dominance     Extremity/Trunk Assessment Upper Extremity Assessment Upper Extremity Assessment: Generalized weakness   Lower Extremity Assessment Lower Extremity Assessment: Generalized weakness   Cervical / Trunk Assessment Cervical / Trunk Assessment: Normal   Communication Communication Communication: HOH   Cognition Arousal/Alertness: Awake/alert Behavior During Therapy: WFL for tasks assessed/performed, Flat affect Overall Cognitive Status: History of cognitive impairments - at baseline Area of Impairment: Orientation, Attention, Memory, Following commands, Safety/judgement, Awareness                 Orientation Level: Disoriented to, Place, Time, Situation Current Attention Level: Focused Memory: Decreased short-term memory Following Commands: Follows one step commands inconsistently Safety/Judgement: Decreased awareness of safety, Decreased awareness of deficits Awareness: Intellectual         General Comments       Exercises     Shoulder Instructions      Home Living Family/patient expects to be discharged to:: Assisted living                                 Additional Comments: baseline dementia- unsure of available DME      Prior Functioning/Environment Prior Level of Function : Needs assist;Patient poor historian/Family not available       Physical Assist : Mobility (physical);ADLs (physical)     Mobility Comments: Per facility, pt stands and step pivots with staff SBA. No AD needed. ADLs Comments: Feeds himself, needs assit with dressing,  bathing, toileting        OT Problem List: Decreased strength;Decreased cognition;Decreased safety awareness;Impaired balance (sitting and/or standing);Decreased activity tolerance;Decreased knowledge of use of DME or AE      OT Treatment/Interventions: Self-care/ADL training;Patient/family education;Therapeutic exercise;Balance training;Therapeutic activities;DME and/or AE instruction;Cognitive remediation/compensation    OT Goals(Current goals can be found in the care plan section) Acute Rehab OT Goals Patient Stated Goal: none stated OT Goal Formulation: Patient unable to participate in goal setting Time For Goal Achievement: 10/31/21 Potential to Achieve Goals: Fair ADL Goals Pt Will Perform Grooming: with supervision;sitting Pt Will Perform Upper Body Dressing: with min assist;sitting Pt Will Perform Lower Body Dressing: with min assist;sit to/from stand;sitting/lateral leans Pt Will Transfer to Toilet: with min assist;stand pivot transfer;bedside commode Pt Will Perform Toileting - Clothing Manipulation and hygiene: with min assist;sitting/lateral leans;sit to/from stand  OT Frequency: Min 2X/week    Co-evaluation              AM-PAC OT "6 Clicks" Daily Activity     Outcome Measure Help from another person eating meals?: A Little Help from another person taking care of personal grooming?: A Little Help from another person toileting, which includes using toliet, bedpan, or urinal?: A Lot Help from another person bathing (including washing, rinsing, drying)?: A Lot Help from another person to put on and taking off regular upper body clothing?: A Lot Help from another person to put on and taking off  regular lower body clothing?: A Lot 6 Click Score: 14   End of Session Equipment Utilized During Treatment: Gait belt;Rolling walker (2 wheels) Nurse Communication: Mobility status  Activity Tolerance: Patient tolerated treatment well Patient left: in bed;with call  bell/phone within reach;with bed alarm set;with restraints reapplied  OT Visit Diagnosis: Unsteadiness on feet (R26.81);Other symptoms and signs involving cognitive function;Muscle weakness (generalized) (M62.81)                Time: 0981-1914 OT Time Calculation (min): 25 min Charges:  OT General Charges $OT Visit: 1 Visit OT Evaluation $OT Eval Moderate Complexity: 1 Mod  [facility] MS, OTR/L ascom [phone]  10/17/21, 6:39 PM

## 2021-10-17 NOTE — TOC Progression Note (Addendum)
Transition of Care [facility]) - Progression Note    Patient Details  Name: Matthew Goodwin MRN: [id number] Date of Birth: 02/20/1920  Transition of Care [facility]) CM/SW [place], RN Phone Number: 10/17/2021, 1:50 PM  Clinical Narrative:     [name] the administrator at the [facility] at [phone] and spoke to her  concerning DC Plan, she would like him to be seen by PT prior to DC, he will be transported back to the group home by [facility] at [phone] when the time comes to [place] [phone] to speak to son , left a general VM For a Call back       Expected Discharge Plan and Services                                                 Social Determinants of Health (SDOH) Interventions    Readmission Risk Interventions     No data to display

## 2021-10-17 NOTE — TOC Progression Note (Signed)
Transition of Care [facility]) - Progression Note    Patient Details  Name: Derryl Uher MRN: [id number] Date of Birth: [birth year]  Transition of Care [facility]) CM/SW [place], RN Phone Number: 10/17/2021, 2:41 PM  Clinical Narrative:    Spoke to the patient's son [name], He confirmed that the plan is to go back to [place] [facility] at [place]        Expected Discharge Plan and Services                                                 Social Determinants of Health (SDOH) Interventions    Readmission Risk Interventions     No data to display

## 2021-10-17 NOTE — Progress Notes (Signed)
PROGRESS NOTE Matthew Goodwin  SWN:[id number] DOB: 05-11-1920 DOA: 10/12/2021 PCP: Sherrie Mustache, MD   Brief Narrative/Hospital Course: Mr. Coach was admitted to the hospital with the working diagnosis of small bowel obstruction.   89 yom w/ T2DM,BPH,dementia and hypertension, present with acute abdominal pain, nausea and vomiting as well as diarrhea. No fevers. EMS was called and patient was transported to the ED. On his initial physical examination his blood pressure was 135/56, 159/103, HR 66, RR 20 and 02 saturation 98%, lungs with no wheezing, heart with S1 and S2 present and rhythmic, abdomen soft, non distended, tender to palpation with no rebound, no lower extremity edema.   Na 144, K 4,2 Cl 112, bicarbonate 25, glucose 157, bun 20 cr 1,18  Wbc 6,7 hgb 11,4 plt 152  Urine analysis with SG >1.046, negative protein, wbc 6-10   EKG 88 bpm, left axis deviation, normal intervals, sinus rhythm with PAC, no significant ST segment or T wave changes.   CT abdomen showed distended left abdominal small bowel with decompression of distal small bowel likely representing obstruction. Badder wall thickening indicating cystitis.  Patient was admitted, general surgery consulted, felt he is not a operative candidate being managed conservatively, palliative care was consulted given overall frail situation. NG tube got accidentally pulled out 10/14 a.m., reevaluated by surgery> patient had large BM in the evening, small bowel follow-through 8 hours postcontrast showed contrast in the decompressed large bowel with gaseous distention of small bowel finding concerning for partial small bowel obstruction, patient was continued on diet by surgery    Subjective: Seen and examined this morning.  Alert awake, mittens in place. Able to tell me his name Follows some commands Not in distress Overnight afebrile He had 4 BM charted.  Assessment and Plan: Principal Problem:   SBO (small bowel obstruction)  (HCC) Active Problems:   Acute lower UTI   BPH (benign prostatic hyperplasia)   DM type 2 (diabetes mellitus, type 2) (HCC)   Dyslipidemia   Essential hypertension   GERD without esophagitis   Dementia with behavioral disturbance (HCC)   Chronic anemia   Hypokalemia   XFG:HWEXH managed conservatively with NGT decompression IV fluids NPO. Seen by general surgery --felt he is not a operative candidate,palliative care was consulted given overall frail situation.NG tube got accidentally pulled out 10/14 a.m., reevaluated by surgery> patient had large BM in the evening, small bowel follow-through 8 hours postcontrast showed contrast in the decompressed large bowel with gaseous distention of small bowel finding concerning for partial small bowel obstruction, patient was continued on diet by surgery.  On clear liquid diet, discussed with surgery, advancing to full liquid diet.  Hypokalemia: Resolved.  Recent Labs  Lab 10/13/21 0454 10/14/21 0545 10/15/21 0604 10/16/21 0530 10/17/21 0612  K 3.7 3.1* 3.4* 3.9 4.0    Hyperchloremic metabolic acidosis monitor Dementia without behavioral disturbances: Poor baseline situation continue supportive care Anemia, from chronic disease stable hemoglobin in 11 gm, monitor.    Urine culture with contamination, Rocephin discontinued Dyslipidemia  cont Lovaza.\ BPH on Flomax monitor output. Essential hypertension: Fairly stable 140s today> patient back on losartan and metoprolol.   GERD continue H2 blocker.  Diabetes mellitus with episodes of hypoglycemia: Continue D5 ivf as p.o. intake is marginal in the setting of #1> diet is being advanced slowly Recent Labs  Lab 10/12/21 0610 10/12/21 1013 10/16/21 1419 10/17/21 0103 10/17/21 0132 10/17/21 0608 10/17/21 0820  GLUCAP  --    < > 94 69* 70 87 84  HGBA1C 5.5  --   --   --   --   --   --    < > = values in this interval not displayed.    Goals of care/at high risk of decompensation currently  full code patient remains frail at risk of decompensation/recent admission and bowel obstruction.  On 10/114: Able to speak to Grace:[phone]-who is with administration at group home,I spoke to her -informed that he has a Son-and his now JJK:[phone] (went to VM- it appears he received the message and came to hospital 10/14) other now:[phone] ( someone picked up but after speaking few words hung up x2) [phone] ( went to VM), [phone] (not in service). Palliative care has been consulted-waiting for palliative care discussion with patient's family Discussed with Corrie Dandy from palliative care who is trying to reach the son, I have provided her with the above number.   DVT prophylaxis: enoxaparin (LOVENOX) injection 40 mg Start: 10/12/21 1000 Code Status:   Code Status: Full Code Family Communication: plan of care discussed with patient/RN at bedside.   Patient status is: Inpatient because of SBO Level of care: Med-Surg  Dispo: The patient is from: Group home            Anticipated disposition: TBD Objective: Vitals last 24 hrs: Vitals:   10/16/21 0740 10/16/21 2211 10/17/21 0100 10/17/21 0817  BP: (!) 149/90 (!) 151/98 (!) 143/97 (!) 141/93  Pulse: (!) 59 74 63 (!) 56  Resp: 16  16   Temp: 98.1 F (36.7 C)  98.2 F (36.8 C) 97.8 F (36.6 C)  TempSrc: Oral  Oral Oral  SpO2:   97% 100%  Weight:      Height:       Weight change:   Physical Examination: General exam: alert awake oriented to self, not in distress HEENT:Oral mucosa moist, Ear/Nose WNL grossly Respiratory system: Bilaterally clear BS, no wheezing, no use of accessory muscle Cardiovascular system: S1 & S2 +, No JVD. Gastrointestinal system: Abdomen soft,NT,ND, BS+ Nervous System: Alert, awake, moving extremities, he follows some commands. Extremities: LE edema neg,distal peripheral pulses palpable.  Skin: No rashes,no icterus. MSK: Normal muscle bulk,tone, power   Medications reviewed:  Scheduled  Meds:  enoxaparin (LOVENOX) injection  40 mg Subcutaneous Q24H   fluorometholone  1 drop Both Eyes Daily   insulin aspart  0-6 Units Subcutaneous TID WC   lidocaine  1 patch Transdermal Q24H   loratadine  10 mg Oral Daily   losartan  50 mg Oral Daily   metoprolol tartrate  25 mg Oral BID   omega-3 acid ethyl esters  1,000 mg Oral BID   pantoprazole  40 mg Oral Daily   tamsulosin  0.4 mg Oral Daily   Continuous Infusions:  dextrose 5% lactated ringers 75 mL/hr at 10/17/21 0249    Diet Order             Diet full liquid Room service appropriate? Yes; Fluid consistency: Thin  Diet effective now                  Intake/Output Summary (Last 24 hours) at 10/17/2021 0916 Last data filed at 10/17/2021 0249 Gross per 24 hour  Intake 1350.38 ml  Output --  Net 1350.38 ml   Net IO Since Admission: 5,074.24 mL [10/17/21 0916]  Wt Readings from Last 3 Encounters:  10/11/21 83.9 kg  05/06/19 86.2 kg  02/13/18 86.2 kg     Unresulted Labs (From admission, onward)  Start     Ordered   10/16/21 0938  Basic metabolic panel  Daily at 5am,   R      10/15/21 0846   10/16/21 0500  CBC  Daily at 5am,   R      10/15/21 0846          Data Reviewed: I have personally reviewed following labs and imaging studies CBC: Recent Labs  Lab 10/13/21 0454 10/14/21 0545 10/15/21 0604 10/16/21 0530 10/17/21 0612  WBC 4.7 5.2 4.6 4.5 4.0  HGB 11.2* 12.6* 11.9* 11.9* 11.0*  HCT 36.4* 38.8* 36.7* 37.1* 33.7*  MCV 93.8 90.7 89.1 91.4 90.6  PLT 159 167 153 146* 182*   Basic Metabolic Panel: Recent Labs  Lab 10/13/21 0454 10/14/21 0545 10/15/21 0604 10/16/21 0530 10/17/21 0612  NA 144 143 143 143 141  K 3.7 3.1* 3.4* 3.9 4.0  CL 112* 109 115* 115* 115*  CO2 25 24 21* 23 22  GLUCOSE 89 105* 139* 114* 108*  BUN 19 14 11 9 10   CREATININE 1.19 0.90 0.92 0.96 1.00  CALCIUM 9.1 9.3 9.0 9.0 8.8*  MG 2.1 1.9 1.8  --   --    GFR: Estimated Creatinine Clearance: 46.8 mL/min (by C-G  formula based on SCr of 1 mg/dL). Liver Function Tests: Recent Labs  Lab 10/11/21 2143  AST 25  ALT 17  ALKPHOS 52  BILITOT 0.5  PROT 7.0  ALBUMIN 3.9   Antimicrobials: Anti-infectives (From admission, onward)    Start     Dose/Rate Route Frequency Ordered Stop   10/12/21 0645  cefTRIAXone (ROCEPHIN) 1 g in sodium chloride 0.9 % 100 mL IVPB  Status:  Discontinued        1 g 200 mL/hr over 30 Minutes Intravenous Every 24 hours 10/12/21 0636 10/14/21 0955      Culture/Microbiology    Component Value Date/Time   SDES  10/12/2021 0318    URINE, RANDOM Performed at [facility], [address] [place], [place] [address]    [facility]  10/12/2021 0318    NONE Performed at [facility], [address], [place], [place] [address]    [place] (A) 10/12/2021 0318   REPTSTATUS 10/13/2021 FINAL 10/12/2021 0318  Radiology Studies: DG Abd Portable 1V-Small Bowel Obstruction Protocol-initial, 8 hr delay  Result Date: 10/15/2021 CLINICAL DATA:  Small bowel obstruction, 8 hour delayed EXAM: PORTABLE ABDOMEN - 1 VIEW COMPARISON:  10/15/2021 FINDINGS: Oral contrast material is seen within decompressed colon to the level of the distal descending colon. Gaseous distention of small bowel loops again noted most compatible with small bowel obstruction. No organomegaly or free air. IMPRESSION: Contrast material within the decompressed large bowel with gaseous distention of small bowel. Findings concerning for partial small bowel obstruction. Electronically Signed   By: Rolm Baptise M.D.   On: 10/15/2021 23:06   DG Abd 1 View  Result Date: 10/15/2021 CLINICAL DATA:  Small-bowel obstruction EXAM: ABDOMEN - 1 VIEW COMPARISON:  10/14/2021, 10/13/2021 FINDINGS: Previously seen enteric tube is no longer identified and may be retracted or removed. Gaseous distension of large and small bowel with progressed colonic distension in the upper  abdomen. No gross free intraperitoneal air. Postsurgical changes within the abdomen. IMPRESSION: 1. Gaseous distension of large and small bowel with progressed colonic distension in the upper abdomen. Findings are compatible with ongoing obstruction. 2. Previously seen enteric tube is no longer identified and may be retracted or removed. Electronically Signed  By: Duanne Guess D.O.   On: 10/15/2021 10:29     LOS: 5 days   Lanae Boast, MD [facility]  10/17/2021, 9:16 AM

## 2021-10-17 NOTE — Progress Notes (Signed)
       CROSS COVER NOTE  NAME: Okley Magnussen MRN: [id number] DOB : 1920-03-03    Date of Service   10/17/2021   HPI/Events of Note   Notified of hypoglycemic event-->CBG 68. Patient treated per hypoglycemic protocol by RN.  Interventions   Assessment/Plan:  D5LR increased to 102mL/hr      This document was prepared using Dragon voice recognition software and may include unintentional dictation errors.  Neomia Glass DNP, MBA, FNP-BC Nurse Practitioner [facility] Pager [phone]

## 2021-10-17 NOTE — TOC Progression Note (Addendum)
Transition of Care [facility]) - Progression Note    Patient Details  Name: Matthew Goodwin MRN: [id number] Date of Birth: Mar 09, 1920  Transition of Care [facility] [address] [address] CM/SW [place], RN Phone Number: 10/17/2021, 9:59 AM  Clinical Narrative:     Received a VM from [place] NP, returned her call to [phone], He    Has some conflicting information about where he lives, [facility] is listed in one area and [place] home listed in another, looked up [facility] number [phone]- [phone] is [facility] years number, the other numbers in the chart are not working    Expected Discharge Plan and Services                                                 Social Determinants of Health ([place]) Interventions    Readmission Risk Interventions     No data to display

## 2021-10-17 NOTE — Evaluation (Signed)
Physical Therapy Evaluation Patient Details Name: Matthew Goodwin MRN: [id number] DOB: 04/20/1920 Today's Date: 10/17/2021  History of Present Illness  Muscab Brenneman is a 88 y.o. African-American male with medical history significant for type 2 diabetes mellitus, BPH, dementia and hypertension, who presented to the emergency room with acute onset of generalized abdominal tenderness with associated nausea and vomiting as well as diarrhea that started tonight at his assisted living facility.   Clinical Impression  Pt admitted with above diagnosis. Pt received upright in recliner agreeable to PT. Pt has baseline dementia and per EMR has difficulty with being accurate historian. Prior to entry, PT called facility. Per facility at baseline pt stands and pivots to surfaces and relies on W/c for mobility. Pt able to feed himself but relies on staff for dressing, bathing, toileting ADL's.   To date, pt able to perform bed mobility with minA at torso brining LE's towards EOB. X1 STS attempted without AD but pt with heavy posterior bias and unable to follow cues for anterior weight shift to stand.  Bed elevated and RW placed in front of pt with pt standing modA and hand over hand cuing for safe hand placement. Pt still with posterior bias reliant on TC's on pelvis and VC's for anterior weight shfit with success as pt stands. MinA needed for RW sequencing with LE steps and VC's for consistent steps and widening BOS to Improve stability. Pt in recliner with all needs in place. Mitts donned. Pt close to baseline function. Rec RW at d/c and [facility] PT to maximize return to PLOF, maximize independence with ADL's and reduce care giver burden. Pt currently with functional limitations due to the deficits listed below (see PT Problem List). Pt will benefit from skilled PT to increase their independence and safety with mobility to allow discharge to the venue listed below.      Recommendations for follow up therapy are one  component of a multi-disciplinary discharge planning process, led by the attending physician.  Recommendations may be updated based on patient status, additional functional criteria and insurance authorization.  Follow Up Recommendations Home health PT      Assistance Recommended at Discharge Frequent or constant Supervision/Assistance  Patient can return home with the following  A little help with walking and/or transfers;A little help with bathing/dressing/bathroom;Assistance with cooking/housework;Assist for transportation;Help with stairs or ramp for entrance    Equipment Recommendations Rolling walker (2 wheels)  Recommendations for Other Services       Functional Status Assessment Patient has had a recent decline in their functional status and demonstrates the ability to make significant improvements in function in a reasonable and predictable amount of time.     Precautions / Restrictions Precautions Precautions: Fall Restrictions Weight Bearing Restrictions: No      Mobility  Bed Mobility Overal bed mobility: Needs Assistance Bed Mobility: Supine to Sit     Supine to sit: HOB elevated, Min assist     General bed mobility comments: able to initiate LE's towards EOB. MinA at torso Patient Response: Cooperative, Flat affect  Transfers Overall transfer level: Needs assistance Equipment used: None, Rolling walker (2 wheels) Transfers: Sit to/from Stand, Bed to chair/wheelchair/BSC Sit to Stand: Max assist, Mod assist, From elevated surface   Step pivot transfers: Min assist       General transfer comment: Unable to stand without AD with HHA+1. Bed elevated, modA+1 and RW to stand. MinA at [facility] for sequencing LE's and RW to recliner.    Ambulation/Gait  General Gait Details: pt does not ambulate at baseline  Stairs            Wheelchair Mobility    Modified Rankin (Stroke Patients Only)       Balance Overall balance assessment:  Needs assistance Sitting-balance support: No upper extremity supported, Feet supported Sitting balance-Leahy Scale: Fair   Postural control: Posterior lean Standing balance support: Bilateral upper extremity supported, During functional activity, Reliant on assistive device for balance Standing balance-Leahy Scale: Poor Standing balance comment: Initial posterior lean in standing. Reliant on AD and CGA from PT to stand.                             Pertinent Vitals/Pain Pain Assessment Pain Assessment: No/denies pain    Home Living Family/patient expects to be discharged to:: Assisted living                   Additional Comments: baseline dementia- unsure of available DME    Prior Function Prior Level of Function : Needs assist       Physical Assist : Mobility (physical);ADLs (physical)     Mobility Comments: Per facility, pt stands and step pivots with staff SBA. No AD needed. ADLs Comments: Feeds himself, needs assit with dressing, bathing, toileting     Hand Dominance        Extremity/Trunk Assessment   Upper Extremity Assessment Upper Extremity Assessment: Generalized weakness    Lower Extremity Assessment Lower Extremity Assessment: Generalized weakness    Cervical / Trunk Assessment Cervical / Trunk Assessment: Normal  Communication   Communication: HOH  Cognition Arousal/Alertness: Awake/alert Behavior During Therapy: WFL for tasks assessed/performed, Flat affect Overall Cognitive Status: History of cognitive impairments - at baseline                                          General Comments      Exercises Other Exercises Other Exercises: Role of PT in acute setting, d/c recs, safe use of DME.   Assessment/Plan    PT Assessment Patient needs continued PT services  PT Problem List Decreased strength;Decreased mobility;Decreased safety awareness;Decreased cognition;Decreased balance;Decreased knowledge of use of  DME       PT Treatment Interventions DME instruction;Therapeutic exercise;Gait training;Balance training;Neuromuscular re-education;Therapeutic activities;Functional mobility training;Patient/family education    PT Goals (Current goals can be found in the Care Plan section)  Acute Rehab PT Goals Patient Stated Goal: wishes to return to group home PT Goal Formulation: With patient Time For Goal Achievement: 10/31/21 Potential to Achieve Goals: Good    Frequency Min 2X/week     Co-evaluation               AM-PAC PT "6 Clicks" Mobility  Outcome Measure Help needed turning from your back to your side while in a flat bed without using bedrails?: A Little Help needed moving from lying on your back to sitting on the side of a flat bed without using bedrails?: A Little Help needed moving to and from a bed to a chair (including a wheelchair)?: A Lot Help needed standing up from a chair using your arms (e.g., wheelchair or bedside chair)?: A Lot Help needed to walk in hospital room?: Total Help needed climbing 3-5 steps with a railing? : Total 6 Click Score: 12    End of Session Equipment Utilized  During Treatment: Gait belt Activity Tolerance: Patient tolerated treatment well Patient left: in chair;with call bell/phone within reach;with chair alarm set Nurse Communication: Mobility status PT Visit Diagnosis: Other abnormalities of gait and mobility (R26.89);Difficulty in walking, not elsewhere classified (R26.2);Muscle weakness (generalized) (M62.81)    Time: 7048-8891 PT Time Calculation (min) (ACUTE ONLY): 20 min   Charges:   PT Evaluation $PT Eval Moderate Complexity: 1 Mod         Marilin Kofman M. Fairly IV, PT, DPT Physical Therapist- [facility]  10/17/2021, 4:08 PM

## 2021-10-17 NOTE — Plan of Care (Signed)

## 2021-10-17 NOTE — Progress Notes (Signed)
Hypoglycemic Event  CBG: 69  Treatment: 4 oz juice/soda  Symptoms: None  Follow-up CBG: Time:0132 CBG Result:70  Possible Reasons for Event: Inadequate meal intake  Comments/MD notified: Jaci Carrel, NP has been notified.  D5 in LR fluids increased to 23ml/hr.  Nurse will reassess CBG again at 0600 as the patient is Q6 CBG checks.    Parke Poisson

## 2021-10-17 NOTE — Progress Notes (Addendum)
[facility] SURGICAL PROGRESS NOTE (cpt [phone])  Hospital Day(s): 5.   Interval History:  Patient seen and examined no acute events or new complaints overnight.  Patient denied any pain but is not reliable given dementia Remains without leukocytosis; WBC 4.0K Hgb stable to 11.0 Renal function normal with sCr - 1.00; UO - unmeasured No electrolyte derangements NGT out over the weekend CLD ordered  He does have numerous BM recorded in last 24 hours  Review of Systems:  Unable to reliably preform secondary to dementia  Vital signs in last 24 hours: [min-max] current  Temp:  [97.8 F (36.6 C)-98.2 F (36.8 C)] 97.8 F (36.6 C) (10/16 0817) Pulse Rate:  [56-74] 56 (10/16 0817) Resp:  [16] 16 (10/16 0100) BP: (141-151)/(93-98) 141/93 (10/16 0817) SpO2:  [97 %-100 %] 100 % (10/16 0817)     Height: 6\' 2"  (188 cm) Weight: 83.9 kg BMI (Calculated): 23.74   Intake/Output last 2 shifts:  10/15 0701 - 10/16 0700 In: 1350.4 [P.O.:720; I.V.:630.4] Out: -    Physical Exam:  Constitutional: alert, cooperative, NAD HENT: normocephalic without obvious abnormality  Respiratory: breathing non-labored at rest  Cardiovascular: regular rate and sinus rhythm  Gastrointestinal: soft, he is non-tender, and non-distended, no rebound/guarding. He is certainly without peritonitis Musculoskeletal: no edema or wounds, motor and sensation grossly intact, NT    Labs:     Latest Ref Rng & Units 10/17/2021    6:12 AM 10/16/2021    5:30 AM 10/15/2021    6:04 AM  CBC  WBC 4.0 - 10.5 K/uL 4.0  4.5  4.6   Hemoglobin 13.0 - 17.0 g/dL 10/17/2021  19.5  09.3   Hematocrit 39.0 - 52.0 % 33.7  37.1  36.7   Platelets 150 - 400 K/uL 130  146  153       Latest Ref Rng & Units 10/17/2021    6:12 AM 10/16/2021    5:30 AM 10/15/2021    6:04 AM  CMP  Glucose 70 - 99 mg/dL 10/17/2021  124  580   BUN 8 - 23 mg/dL 10  9  11    Creatinine 0.61 - 1.24 mg/dL 998   3.38   Sodium 135 - 145 mmol/L 141   143  143   Potassium 3.5 - 5.1 mmol/L 4.0  3.9  3.4   Chloride 98 - 111 mmol/L 115  115  115   CO2 22 - 32 mmol/L 22  23  21    Calcium 8.9 - 10.3 mg/dL 8.8  9.0  9.0      Imaging studies: No new pertinent imaging studies   Assessment/Plan: (ICD-10's: K20.609) 88 y.o. male with ROBF admitted with potential SBO vs ileus, complicated by significant comorbid disease and dementia   - He is without any clinical evidence of obstruction and now with bowel function return (multiple BM recorded). I think it is reasonable to continue CLD as tolerated and advance as feasible. If any concerns over dysphagia would not hesitate to engage SLP  - No need for NGT decompression at this time - No role for surgical intervention. He is not a surgical candidate given his comorbidities, dementia, and deconditioning - Palliative on-board; formal consult pending - Monitor abdominal examination; on-going bowel function - Serial KUBs as needed - Pain control prn; antiemetics prn   - Mobilize/Work with therapies as feasible  - Further management per primary service  - Nothing to offer nor needs from surgical perspective at this time. Would advance diet  as tolerated/feasible. He is not an operative candidate and GOC should be addressed with palliative once available. We will remain available.   All of the above findings and recommendations were discussed with the medical team.   -- Edison Simon, PA-C [facility] 10/17/2021, 8:29 AM M-F: 7am - 4pm

## 2021-10-17 NOTE — Progress Notes (Signed)
Subjective:  CC: Matthew Goodwin is a 88 y.o. male  Hospital stay day 4,   SBO  HPI: Reported bowel movement today.  Tolerating clears.  ROS:  Unable to obtain secondary to patient mentation   Objective:   Vital signs stable  i   Constitutional :  alert  Respiratory:  clear to auscultation bilaterally  Cardiovascular:  regular rate and rhythm  Gastrointestinal: soft, non-tender; bowel sounds normal; no masses,  no organomegaly.   Skin: Cool and moist.   Psychiatric: Normal affect, non-agitated       LABS:  No leukocytosis   RADS: CLINICAL DATA:  Small bowel obstruction, 8 hour delayed   EXAM: PORTABLE ABDOMEN - 1 VIEW   COMPARISON:  10/15/2021   FINDINGS: Oral contrast material is seen within decompressed colon to the level of the distal descending colon. Gaseous distention of small bowel loops again noted most compatible with small bowel obstruction. No organomegaly or free air.   IMPRESSION: Contrast material within the decompressed large bowel with gaseous distention of small bowel. Findings concerning for partial small bowel obstruction.     Electronically Signed   By: Rolm Baptise M.D.   On: 10/15/2021 23:06   Assessment:   Partial small bowel obstruction.  Clinically resolving.  Discussed goals of care with son who was at bedside this morning and he agrees no surgical intervention at this point.  Advance diet as tolerated and if safe to do so from a speech swallow standpoint.  labs/images/medications/previous chart entries reviewed personally and relevant changes/updates noted above.

## 2021-10-18 DIAGNOSIS — K56609 Unspecified intestinal obstruction, unspecified as to partial versus complete obstruction: Secondary | ICD-10-CM | POA: Diagnosis not present

## 2021-10-18 DIAGNOSIS — N390 Urinary tract infection, site not specified: Secondary | ICD-10-CM | POA: Diagnosis not present

## 2021-10-18 DIAGNOSIS — N400 Enlarged prostate without lower urinary tract symptoms: Secondary | ICD-10-CM | POA: Diagnosis not present

## 2021-10-18 DIAGNOSIS — R627 Adult failure to thrive: Secondary | ICD-10-CM

## 2021-10-18 DIAGNOSIS — D649 Anemia, unspecified: Secondary | ICD-10-CM | POA: Diagnosis not present

## 2021-10-18 LAB — BASIC METABOLIC PANEL
Anion gap: 4 — ABNORMAL LOW (ref 5–15)
BUN: 9 mg/dL (ref 8–23)
CO2: 23 mmol/L (ref 22–32)
Calcium: 9.4 mg/dL (ref 8.9–10.3)
Chloride: 114 mmol/L — ABNORMAL HIGH (ref 98–111)
Creatinine, Ser: 1.14 mg/dL (ref 0.61–1.24)
GFR, Estimated: 58 mL/min — ABNORMAL LOW (ref >60)
Glucose, Bld: 118 mg/dL — ABNORMAL HIGH (ref 70–99)
Potassium: 3.7 mmol/L (ref 3.5–5.1)
Sodium: 141 mmol/L (ref 135–145)

## 2021-10-18 LAB — CBC
HCT: 34.7 % — ABNORMAL LOW (ref 39.0–52.0)
Hemoglobin: 11.3 g/dL — ABNORMAL LOW (ref 13.0–17.0)
MCH: 29.6 pg (ref 26.0–34.0)
MCHC: 32.6 g/dL (ref 30.0–36.0)
MCV: 90.8 fL (ref 80.0–100.0)
Platelets: 130 10*3/uL — ABNORMAL LOW (ref 150–400)
RBC: 3.82 MIL/uL — ABNORMAL LOW (ref 4.22–5.81)
RDW: 13.2 % (ref 11.5–15.5)
WBC: 5.4 10*3/uL (ref 4.0–10.5)
nRBC: 0 % (ref 0.0–0.2)

## 2021-10-18 LAB — GLUCOSE, CAPILLARY
Glucose-Capillary: 103 mg/dL — ABNORMAL HIGH (ref 70–99)
Glucose-Capillary: 107 mg/dL — ABNORMAL HIGH (ref 70–99)
Glucose-Capillary: 90 mg/dL (ref 70–99)

## 2021-10-18 NOTE — Progress Notes (Signed)
[facility] here to pick up patient. Patient wheeled out by staff. Discharged packet handed to [facility] staff.
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
## 2021-10-18 NOTE — TOC Progression Note (Signed)
Transition of Care [facility]) - Progression Note    Patient Details  Name: Matthew Goodwin MRN: [id number] Date of Birth: [birth year]  Transition of Care [facility]) CM/SW [place], RN Phone Number: 10/18/2021, 1:49 PM  Clinical Narrative:     Damaris Schooner with Madelynn Done from [place], he will pick up the patient at 4-430 PM today, [facility] accepted the patient for [facility]  I called the patient's son [name] and left a voice mail for a call back to notify him about the DC       Expected Discharge Plan and Services           Expected Discharge Date: 10/18/21                                     Social Determinants of Health (SDOH) Interventions    Readmission Risk Interventions     No data to display

## 2021-10-18 NOTE — Progress Notes (Signed)
[facility]) Hospital Liaison note:  Notified via Epic workque from Dr. Max Sane of request for [place] services. Will continue to follow for disposition.  Please call with any outpatient palliative questions or concerns.  Thank you for the opportunity to participate in this patient's care.  Thank you, Lorelee Market, LPN [facility] Liaison [phone]

## 2021-10-18 NOTE — Progress Notes (Signed)
Patient ID: Matthew Goodwin, male   DOB: 09/27/1920, 88 y.o.   MRN: [id number]    Progress Note from the Palliative Medicine Team at [facility]   Patient Name: Matthew Goodwin        Date: 10/18/2021 DOB: May 18, 1920  Age: 88 y.o. MRN#: [id number] Attending Physician: Max Sane, MD Primary Care Physician: Casilda Carls, MD Admit Date: 10/12/2021   Medical records reviewed, discussed with bedside RN  88 y.o. male   admitted on 10/12/2021 with past  medical history significant for type 2 diabetes mellitus, BPH, dementia and hypertension, who presented to the emergency room with acute onset of generalized abdominal tenderness with associated nausea and vomiting as well as diarrhea that started tonight at group home  facility.  Patient does not have medical decision-making capacity.     ED Course: When came to the ER, BP was 135/96 with temperature 97.4 and otherwise unremarkable vital signs.  Labs revealed unremarkable CMP and CBC showed anemia.  UA showed 6-10 WBCs with more than 1046% gravity and 6-10 RBCs with trace leukocytes.    Imaging: Abdomen portable x-ray showed NG tube tip and side port projecting within the stomach. Abdominal pelvic CT scan showed the following: 1. Fluid distended left abdominal small bowel with decompression of distal small bowel likely representing obstruction. 2. Aortic atherosclerosis. 3. Prominent bladder wall thickening likely indicating cystitis. Correlate with urinalysis.   Today is day 6 of his hospitalization.  Patient is progressing with full liquid diet, working with therapies   Treatment option decisions, advanced directive decisions and anticipatory care needs are pending.   This NP spoke by phone,to patient's son Praneeth Bussey of kin for ongoing education regarding patient's current medical situation.  His son tells me that he has been living in a group home for the last 5 years   Plan of Care: -DNR/DNI-documented today  -per attending  patient will be discharged back to facility today.    Education offered today regarding  the importance of continued conversation with the  medical providers and group home employees regarding overall plan of care and treatment options,  ensuring decisions are within the context of the patients values and GOCs.  Education offered on the patient's high risk for decompensation 2/2 to dementia and multiple co-morbidities.   Encouraged ongoing discussion regarding treatment plan.   Recommend OP Palliative services.   Questions and concerns addressed   Discussed with treatment team   25 minutes   Wadie Lessen NP  Palliative Medicine Team Team Phone # [phone] Pager [phone]

## 2021-10-18 NOTE — Progress Notes (Signed)
Physical Therapy Treatment Patient Details Name: Matthew Goodwin MRN: [id number] DOB: 09-29-1920 Today's Date: 10/18/2021   History of Present Illness Zayn Clemence is a 88 y.o. African-American male with medical history significant for type 2 diabetes mellitus, BPH, dementia and hypertension, who presented to the emergency room with acute onset of generalized abdominal tenderness with associated nausea and vomiting as well as diarrhea that started tonight at his assisted living facility    PT Comments    Pt received in bed agreeable to PT. Noted BM on chuck pad with RN coming into room to assist. Pt transfers to EOB with minA at torso with pt able to mobilize UE's/LE's to EOB but required some hand over hand cuing and placement to assist in mobility.  X3 standing bouts performed with hand over hand cuing required for RN to perform perihygiene lasting around 15-20 seconds/bout. Pt attempting multiple forward steps leading to standing imbalance requiring PT min to modA and multimodal cues to prevent stepping. Once following commands, notable improvement in stability performing step pivot transfer with minA on RW to improve safe sequencing with LE steps. Pt placed in recliner in care of RN. D/c recs remain appropriate.    Recommendations for follow up therapy are one component of a multi-disciplinary discharge planning process, led by the attending physician.  Recommendations may be updated based on patient status, additional functional criteria and insurance authorization.  Follow Up Recommendations  Home health PT     Assistance Recommended at Discharge Frequent or constant Supervision/Assistance  Patient can return home with the following A little help with walking and/or transfers;A little help with bathing/dressing/bathroom;Assistance with cooking/housework;Assist for transportation;Help with stairs or ramp for entrance   Equipment Recommendations       Recommendations for Other Services        Precautions / Restrictions Precautions Precautions: Fall Restrictions Weight Bearing Restrictions: No     Mobility  Bed Mobility Overal bed mobility: Needs Assistance Bed Mobility: Supine to Sit     Supine to sit: HOB elevated, Min assist       Patient Response: Cooperative, Flat affect  Transfers Overall transfer level: Needs assistance Equipment used: Rolling walker (2 wheels) Transfers: Bed to chair/wheelchair/BSC Sit to Stand: Mod assist, From elevated surface   Step pivot transfers: Min assist       General transfer comment: Required mod VC's to keep feet on floor. This date pt wanting to step forwards. Once following commands, stability greatly improves with minA to transfer to recliner with minA on RW for sequencing.    Ambulation/Gait               General Gait Details: pt does not ambulate at baseline   Stairs             Wheelchair Mobility    Modified Rankin (Stroke Patients Only)       Balance Overall balance assessment: Needs assistance Sitting-balance support: No upper extremity supported, Feet supported Sitting balance-Leahy Scale: Fair     Standing balance support: Bilateral upper extremity supported, During functional activity, Reliant on assistive device for balance Standing balance-Leahy Scale: Poor Standing balance comment: reliant on AD and PT assist                            Cognition Arousal/Alertness: Awake/alert Behavior During Therapy: WFL for tasks assessed/performed, Flat affect Overall Cognitive Status: History of cognitive impairments - at baseline  General Comments: pleasant and cooperative. Requires increased time, multimodal cuing.        Exercises      General Comments        Pertinent Vitals/Pain Pain Assessment Pain Assessment: No/denies pain    Home Living                          Prior Function            PT  Goals (current goals can now be found in the care plan section) Acute Rehab PT Goals Patient Stated Goal: wishes to return to group home PT Goal Formulation: With patient Time For Goal Achievement: 10/31/21 Progress towards PT goals: Progressing toward goals    Frequency    Min 2X/week      PT Plan Current plan remains appropriate    Co-evaluation              AM-PAC PT "6 Clicks" Mobility   Outcome Measure  Help needed turning from your back to your side while in a flat bed without using bedrails?: A Little Help needed moving from lying on your back to sitting on the side of a flat bed without using bedrails?: A Little Help needed moving to and from a bed to a chair (including a wheelchair)?: A Lot Help needed standing up from a chair using your arms (e.g., wheelchair or bedside chair)?: A Lot Help needed to walk in hospital room?: Total Help needed climbing 3-5 steps with a railing? : Total 6 Click Score: 12    End of Session Equipment Utilized During Treatment: Gait belt Activity Tolerance: Patient tolerated treatment well Patient left: in chair;with call bell/phone within reach;with chair alarm set Nurse Communication: Mobility status PT Visit Diagnosis: Other abnormalities of gait and mobility (R26.89);Difficulty in walking, not elsewhere classified (R26.2);Muscle weakness (generalized) (M62.81)     Time: 8841-6606 PT Time Calculation (min) (ACUTE ONLY): 17 min  Charges:  $Therapeutic Activity: 8-22 mins                     Salem Caster. Fairly IV, PT, DPT Physical Therapist- [facility]  10/18/2021, 2:43 PM

## 2021-10-18 NOTE — Plan of Care (Signed)

## 2021-10-18 NOTE — Care Management Important Message (Signed)
Important Message  Patient Details  Name: Ronrico Dupin MRN: [id number] Date of Birth: [birth year]   Medicare Important Message Given:  Yes  Obtained verbal consent from son, Tilman Mcclaren by phone [phone]). Will send copy to be scanned into the patient's medical record.  I thanked him for his time.    Juliann Pulse A Alexsandria Kivett 10/18/2021, 2:27 PM

## 2021-11-02 ENCOUNTER — Other Ambulatory Visit: Payer: Medicare Other | Admitting: Student

## 2021-11-02 DIAGNOSIS — K56609 Unspecified intestinal obstruction, unspecified as to partial versus complete obstruction: Secondary | ICD-10-CM

## 2021-11-02 DIAGNOSIS — Z515 Encounter for palliative care: Secondary | ICD-10-CM

## 2021-11-02 DIAGNOSIS — R531 Weakness: Secondary | ICD-10-CM

## 2021-11-02 NOTE — Progress Notes (Signed)
[facility] Palliative Care Consult Note Telephone: [phone]  Fax: [phone]   Date of encounter: 11/02/21 10:44 PM PATIENT NAME: Matthew Goodwin [id number]   [phone] (home)  DOB: [birth year] MRN: [id number] PRIMARY CARE PROVIDER:    Casilda Carls, MD,  [place] [address] [phone]  REFERRING PROVIDER:   Casilda Carls, [place],  [place] [address] [phone]  RESPONSIBLE PARTY:    Contact Information     Name Relation Home Work Mobile   Geremy, Rister   [phone]   [name] Other [phone]  [phone]   [place] Years            I met face to face with patient in the family care home. Palliative Care was asked to follow this patient by consultation request of  Alver Fisher, NP to address advance care planning and complex medical decision making. This is the initial visit.                                     ASSESSMENT AND PLAN / RECOMMENDATIONS:   Advance Care Planning/Goals of Care: Goals include to maximize quality of life and symptom management. Patient/health care surrogate gave his/her permission to discuss.Our advance care planning conversation included a discussion about:    The value and importance of advance care planning  Experiences with loved ones who have been seriously ill or have died  Exploration of personal, cultural or spiritual beliefs that might influence medical decisions  CODE STATUS: DNR  Education provided on Palliative Medicine. He resides at family care home. He does have a DNR on file now at facility. Left message for patient's son [name] to review goals of care; awaiting return call. Will provide supportive care, symptom management as needed. Monitor for changes and declines.  Symptom Management/Plan:  Generalized weakness- continue HH therapy as directed. W/c used for locomotion. Monitor for  falls/safety. Caregivers to continue assisting with adl's.  Small bowel obstruction-recent hospitalization d/t SBO. Conservative care; not a candidate for invasive interventions. He is having regular bowel movements; continue colace as directed. Monitor for abdominal pain, diarrhea, nausea, vomiting, decline in appetite.   Follow up Palliative Care Visit: Palliative care will continue to follow for complex medical decision making, advance care planning, and clarification of goals. Return in 6-8 weeks or prn.   This visit was coded based on medical decision making (MDM).  PPS: 40%  HOSPICE ELIGIBILITY/DIAGNOSIS: TBD  Chief Complaint: Palliative Medicine initial consult.   HISTORY OF PRESENT ILLNESS:  Matthew Goodwin is a 88 y.o. year old male  with dementia with behavioral disturbance, BPH, T2DM, essential hypertension, GERD, chronic anemia, hx of L1 compression fracture. Patient hospitalized 10/11-10/17/23 due to SBO, UTI.  Patient resides at family care home. Staff report patient has been doing well. Weakness endorsed; he is receiving HH therapy with [facility]. He requires assistance with adl's. Transfers with assist x 1, able to take a few steps. No falls reported. Appetite has been good; tolerated regular diet. He is moving bowels regularly. No nausea or vomiting. Patient received sitting up to w/c. He answers direct questions. Does not exhibit any signs of pain or discomfort.  History obtained from review of EMR, discussion with primary team, and interview with family, facility staff/caregiver and/or Mr. Turnage.  I reviewed available labs, medications, imaging, studies and  related documents from the EMR.  Records reviewed and summarized above.   ROS  Per patient and caregivers d/t his dementia.   Physical Exam: Pulse 60, resp 16, sats 99% on room air Constitutional: NAD General: frail appearing, thin EYES: anicteric sclera, lids intact, no discharge  ENMT: intact hearing, oral  mucous membranes moist, dentition intact CV: S1S2, RRR, no LE edema Pulmonary: LCTA, no increased work of breathing, no cough, room air Abdomen: normo-active BS + 4 quadrants, soft and non tender, no ascites GU: deferred MSK: moves all extremities, w/c for locomotion Skin: warm and dry, no rashes or wounds on visible skin Neuro: +generalized weakness,  + cognitive impairment Psych: non-anxious affect, A and O to person Hem/lymph/immuno: no widespread bruising CURRENT PROBLEM LIST:  Patient Active Problem List   Diagnosis Date Noted   Hypokalemia 10/16/2021   Chronic anemia 10/16/2021   Dementia with behavioral disturbance ([place]) 10/16/2021   SBO (small bowel obstruction) ([place]) 10/12/2021   GERD without esophagitis 10/12/2021   Essential hypertension 10/12/2021   BPH (benign prostatic hyperplasia) 10/12/2021   Dyslipidemia 10/12/2021   Closed compression fracture of L2 vertebra ([place]) 05/07/2019   Back pain 05/06/2019   Fall at home, initial encounter 05/06/2019   Closed compression fracture of body of L1 vertebra ([place]) 05/06/2019   Hydroureteronephrosis 05/06/2019   Acute lower UTI 05/06/2019   DM type 2 (diabetes mellitus, type 2) ([place]) 05/06/2019   Aortic stenosis 05/06/2019   Benign essential HTN 05/06/2019   PAST MEDICAL HISTORY:  Active Ambulatory Problems    Diagnosis Date Noted   Back pain 05/06/2019   Fall at home, initial encounter 05/06/2019   Closed compression fracture of body of L1 vertebra ([place]) 05/06/2019   Hydroureteronephrosis 05/06/2019   Acute lower UTI 05/06/2019   DM type 2 (diabetes mellitus, type 2) ([place]) 05/06/2019   Aortic stenosis 05/06/2019   Benign essential HTN 05/06/2019   Closed compression fracture of L2 vertebra ([place]) 05/07/2019   SBO (small bowel obstruction) ([place]) 10/12/2021   GERD without esophagitis 10/12/2021   Essential hypertension 10/12/2021   BPH (benign prostatic hyperplasia) 10/12/2021   Dyslipidemia 10/12/2021   Hypokalemia  10/16/2021   Chronic anemia 10/16/2021   Dementia with behavioral disturbance ([place]) 10/16/2021   Resolved Ambulatory Problems    Diagnosis Date Noted   No Resolved Ambulatory Problems   Past Medical History:  Diagnosis Date   Anemia    Diabetes mellitus without complication ([place])    Edema    Hypertension    Hypoglycemia    Renal insufficiency    Urinary retention    SOCIAL HX:  Social History   Tobacco Use   Smoking status: Never    Passive exposure: Never   Smokeless tobacco: Never  Substance Use Topics   Alcohol use: Not Currently   FAMILY HX:  Family History  Family history unknown: Yes      ALLERGIES: No Known Allergies   PERTINENT MEDICATIONS:  Outpatient Encounter Medications as of 11/02/2021  Medication Sig   acetaminophen (TYLENOL) 325 MG tablet Take 650 mg by mouth every 6 (six) hours as needed for mild pain or moderate pain.   atorvastatin (LIPITOR) 10 MG tablet Take 10 mg by mouth at bedtime.   Cholecalciferol 1.25 MG (50000 UT) capsule Take 1 capsule by mouth once a week.   docusate sodium (COLACE) 100 MG capsule Take 100 mg by mouth daily.   famotidine (PEPCID) 20 MG tablet Take 20 mg by mouth at bedtime.   ferrous  sulfate 325 (65 FE) MG tablet Take 325 mg by mouth daily.   fluorometholone (FML) 0.1 % ophthalmic suspension Place 1 drop into both eyes daily.   levocetirizine (XYZAL) 5 MG tablet Take 5 mg by mouth daily.    losartan (COZAAR) 50 MG tablet Take 50 mg by mouth daily.    metoprolol succinate (TOPROL-XL) 100 MG 24 hr tablet Take 100 mg by mouth daily.    metoprolol succinate (TOPROL-XL) 50 MG 24 hr tablet Take 50 mg by mouth daily.   Omega-3 Fatty Acids (FISH OIL) 1000 MG CAPS Take 1,000 mg by mouth 2 (two) times daily.   tamsulosin (FLOMAX) 0.4 MG CAPS capsule Take 0.4 mg by mouth daily.    Vitamin D, Ergocalciferol, (DRISDOL) 1.25 MG (50000 UNIT) CAPS capsule Take 50,000 Units by mouth every Wednesday.   No facility-administered encounter  medications on file as of 11/02/2021.   Thank you for the opportunity to participate in the care of Mr. Hilleary.  The palliative care team will continue to follow. Please call our office at [phone] if we can be of additional assistance.   Ezekiel Slocumb, NP   COVID-19 PATIENT SCREENING TOOL Asked and negative response unless otherwise noted:  Have you had symptoms of covid, tested positive or been in contact with someone with symptoms/positive test in the past 5-10 days?No

## 2021-11-29 ENCOUNTER — Encounter: Payer: Self-pay | Admitting: Nurse Practitioner

## 2021-11-29 ENCOUNTER — Non-Acute Institutional Stay: Payer: Medicare Other | Admitting: Nurse Practitioner

## 2021-11-29 DIAGNOSIS — R531 Weakness: Secondary | ICD-10-CM

## 2021-11-29 DIAGNOSIS — F03918 Unspecified dementia, unspecified severity, with other behavioral disturbance: Secondary | ICD-10-CM

## 2021-11-29 DIAGNOSIS — Z515 Encounter for palliative care: Secondary | ICD-10-CM

## 2021-11-29 NOTE — Progress Notes (Signed)
Matthew Goodwin Consult Note Telephone: [phone]  Fax: [phone]    Date of encounter: 11/29/21 9:22 PM PATIENT NAME: Matthew Goodwin [id number]   [phone] (home)  DOB: 1920-01-06 MRN: [id number] PRIMARY CARE PROVIDER:    [facility]  RESPONSIBLE PARTY:    Contact Information     Name Relation Home Work Mobile   [name]   [phone]   [name] Other [phone]  [phone]   [name]          I met face to face with patient in facility. Palliative Care was asked to follow this patient by consultation request of  Matthew Carls, MD to address advance care planning and complex medical decision making. This is a follow up visit.  ASSESSMENT AND PLAN / RECOMMENDATIONS:  Advance Care Planning/Goals of Care: Goals include to maximize quality of life and symptom management. Patient/health care surrogate gave his/her permission to discuss.Our advance care planning conversation included a discussion about:    The value and importance of advance care planning  Experiences with loved ones who have been seriously ill or have died  Exploration of personal, cultural or spiritual beliefs that might influence medical decisions  CODE STATUS: DNR   Education provided on Palliative Medicine. He resides at family care home. He does have a DNR on file now at facility. Left message for patient's son Matthew Goodwin to review goals of care; awaiting return call. Will provide supportive care, symptom management as needed. Monitor for changes and declines.   Symptom Management/Plan: 1. Advance Care Planning;   2. Goals of Care: Goals include to maximize quality of life and symptom management. Our advance care planning conversation included a discussion about:    The value and importance of advance care planning  Exploration of personal, cultural or spiritual beliefs that  might influence medical decisions  Exploration of goals of care in the event of a sudden injury or illness  Identification and preparation of a healthcare agent  Review and updating or creation of an advance directive document. 3. Palliative care encounter; Palliative care encounter; Palliative medicine team will continue to support patient, patient's family, and medical team. Visit consisted of counseling and education dealing with the complex and emotionally intense issues of symptom management and palliative care in the setting of serious and potentially life-threatening illness   4. Generalized weakness- W/C used for mobility. Discussed with Matthew Goodwin falls/safety. Caregivers to continue assisting with adl's.   5. H/O Small bowel obstruction-recent hospitalization d/t SBO. Conservative care; not a candidate for invasive interventions. He is having regular bowel movements; continue colace as directed. Monitor for abdominal pain, diarrhea, nausea, vomiting, decline in appetite.    Follow up Palliative Care Visit: Palliative care will continue to follow for complex medical decision making, advance care planning, and clarification of goals. Return in 6-8 weeks or prn. PPS: 40% Chief Complaint: Follow up palliative consult for complex medical decision making, address goals, manage ongoing symptoms  I spent 43 minutes providing this consultation. More than 50% of the time in this consultation was spent in counseling and care coordination.    HISTORY OF PRESENT ILLNESS:  Matthew Goodwin is a 88 y.o. year old male  with dementia with behavioral disturbance, BPH, T2DM, essential hypertension, GERD, chronic anemia, hx of L1 compression fracture. Hospitalized 10/11-10/17/23 due to SBO, UTI. I visited and observed Matthew Goodwin currently residing at [facility]. Matthew  Goodwin was sitting in the w/c in the hall. Matthew Goodwin requires assistance with mobility, transfers, adl's. Matthew Goodwin feeds himself with  fairly good appetite, no noted weight loss or change in clothes size per staff. No recent falls, wounds. Matthew Goodwin and I talked about purpose of pc visit. We talked about ros, functional abilities, things that bring him joy. We talked about residing at facility, appetite, foods that he enjoys. Discussions limited with cognitive impairment. Matthew Goodwin appears stable currently, medical goals, goc, medications reviewed, no new changes recommended today. Therapeutic listening, emotional support provided. Questions answered. Updated staff.    History obtained from review of EMR, discussion with primary team, and interview with family, facility staff/caregiver and/or Matthew. Goodwin.  I reviewed available labs, medications, imaging, studies and related documents from the EMR.  Records reviewed and summarized above.    ROS   Per patient and caregivers d/t his dementia. Physical Exam: Pulse 60, resp 16, sats 99% on room air Constitutional: NAD General: frail appearing, thin EYES: anicteric sclera, lids intact, no discharge  ENMT: intact hearing, oral mucous membranes moist, dentition intact CV: S1S2, RRR, no LE edema Pulmonary: LCTA, no increased work of breathing, no cough, room air Abdomen: normo-active BS + 4 quadrants, soft and non tender, no ascites GU: deferred MSK: moves all extremities, w/c for locomotion Skin: warm and dry, no rashes or wounds on visible skin Neuro: +generalized weakness,  + cognitive impairment Psych: non-anxious affect, A and O to person Thank you for the opportunity to participate in the care of Matthew Goodwin. Please call our office at [phone] if we can be of additional assistance.   Tamatha Gadbois Matthew Gully, NP

## 2021-12-13 ENCOUNTER — Other Ambulatory Visit: Payer: Self-pay

## 2021-12-13 ENCOUNTER — Observation Stay
Admission: EM | Admit: 2021-12-13 | Discharge: 2021-12-14 | Disposition: A | Discharge status: Home / Self Care | Payer: Medicare Other | Attending: Internal Medicine | Admitting: Internal Medicine

## 2021-12-13 ENCOUNTER — Encounter: Payer: Self-pay | Admitting: Internal Medicine

## 2021-12-13 DIAGNOSIS — K219 Gastro-esophageal reflux disease without esophagitis: Secondary | ICD-10-CM | POA: Insufficient documentation

## 2021-12-13 DIAGNOSIS — N390 Urinary tract infection, site not specified: Secondary | ICD-10-CM | POA: Diagnosis not present

## 2021-12-13 DIAGNOSIS — E119 Type 2 diabetes mellitus without complications: Secondary | ICD-10-CM | POA: Insufficient documentation

## 2021-12-13 DIAGNOSIS — E860 Dehydration: Secondary | ICD-10-CM | POA: Insufficient documentation

## 2021-12-13 DIAGNOSIS — Z79899 Other long term (current) drug therapy: Secondary | ICD-10-CM | POA: Insufficient documentation

## 2021-12-13 DIAGNOSIS — I10 Essential (primary) hypertension: Secondary | ICD-10-CM | POA: Diagnosis not present

## 2021-12-13 DIAGNOSIS — F03918 Unspecified dementia, unspecified severity, with other behavioral disturbance: Secondary | ICD-10-CM | POA: Diagnosis not present

## 2021-12-13 DIAGNOSIS — R531 Weakness: Principal | ICD-10-CM

## 2021-12-13 DIAGNOSIS — D649 Anemia, unspecified: Secondary | ICD-10-CM | POA: Diagnosis present

## 2021-12-13 DIAGNOSIS — N400 Enlarged prostate without lower urinary tract symptoms: Secondary | ICD-10-CM | POA: Diagnosis present

## 2021-12-13 LAB — LACTIC ACID, PLASMA: Lactic Acid, Venous: 2 mmol/L (ref 0.5–1.9)

## 2021-12-13 LAB — URINALYSIS, ROUTINE W REFLEX MICROSCOPIC
Bilirubin Urine: NEGATIVE
Glucose, UA: NEGATIVE mg/dL
Ketones, ur: NEGATIVE mg/dL
Nitrite: NEGATIVE
Protein, ur: 30 mg/dL — AB
RBC / HPF: >50 RBC/hpf — ABNORMAL HIGH (ref 0–5)
Specific Gravity, Urine: 1.016 (ref 1.005–1.030)
WBC, UA: >50 WBC/hpf — ABNORMAL HIGH (ref 0–5)
pH: 7 (ref 5.0–8.0)

## 2021-12-13 LAB — TROPONIN I (HIGH SENSITIVITY)
Troponin I (High Sensitivity): 45 ng/L — ABNORMAL HIGH (ref <18)
Troponin I (High Sensitivity): 44 ng/L — ABNORMAL HIGH (ref <18)

## 2021-12-13 LAB — BASIC METABOLIC PANEL
Anion gap: 11 (ref 5–15)
BUN: 18 mg/dL (ref 8–23)
CO2: 22 mmol/L (ref 22–32)
Calcium: 9.8 mg/dL (ref 8.9–10.3)
Chloride: 111 mmol/L (ref 98–111)
Creatinine, Ser: 1.05 mg/dL (ref 0.61–1.24)
GFR, Estimated: >60 mL/min (ref >60)
Glucose, Bld: 136 mg/dL — ABNORMAL HIGH (ref 70–99)
Potassium: 4.5 mmol/L (ref 3.5–5.1)
Sodium: 144 mmol/L (ref 135–145)

## 2021-12-13 LAB — CBC
HCT: 43.2 % (ref 39.0–52.0)
Hemoglobin: 13.3 g/dL (ref 13.0–17.0)
MCH: 29.2 pg (ref 26.0–34.0)
MCHC: 30.8 g/dL (ref 30.0–36.0)
MCV: 94.9 fL (ref 80.0–100.0)
Platelets: 223 10*3/uL (ref 150–400)
RBC: 4.55 MIL/uL (ref 4.22–5.81)
RDW: 13 % (ref 11.5–15.5)
WBC: 7.7 10*3/uL (ref 4.0–10.5)
nRBC: 0 % (ref 0.0–0.2)

## 2021-12-13 LAB — TYPE AND SCREEN

## 2021-12-13 MED ORDER — ACETAMINOPHEN 325 MG PO TABS: 650.0000 mg | ORAL_TABLET | Freq: Four times a day (QID) | ORAL | Status: DC | PRN

## 2021-12-13 MED ORDER — SODIUM CHLORIDE 0.9 % IV BOLUS
1000.0000 mL | Freq: Once | INTRAVENOUS | Status: AC
  Administered 2021-12-13: 1000 mL via INTRAVENOUS

## 2021-12-13 MED ORDER — FLUOROMETHOLONE 0.1 % OP SUSP
1.0000 [drp] | Freq: Every day | OPHTHALMIC | Status: DC
  Filled 2021-12-13: qty 5

## 2021-12-13 MED ORDER — PANTOPRAZOLE SODIUM 40 MG IV SOLR
40.0000 mg | Freq: Two times a day (BID) | INTRAVENOUS | Status: DC
  Administered 2021-12-13 – 2021-12-14 (×2): 40 mg via INTRAVENOUS
  Filled 2021-12-13 (×2): qty 10

## 2021-12-13 MED ORDER — MORPHINE SULFATE (PF) 2 MG/ML IV SOLN: 2.0000 mg | INTRAVENOUS | Status: DC | PRN

## 2021-12-13 MED ORDER — ACETAMINOPHEN 325 MG RE SUPP: 650.0000 mg | Freq: Four times a day (QID) | RECTAL | Status: DC | PRN

## 2021-12-13 MED ORDER — LOSARTAN POTASSIUM 50 MG PO TABS
50.0000 mg | ORAL_TABLET | Freq: Every day | ORAL | Status: DC
  Administered 2021-12-14: 50 mg via ORAL
  Filled 2021-12-13: qty 1

## 2021-12-13 MED ORDER — HYDROCODONE-ACETAMINOPHEN 5-325 MG PO TABS: 1.0000 | ORAL_TABLET | ORAL | Status: DC | PRN

## 2021-12-13 MED ORDER — SODIUM CHLORIDE 0.9 % IV SOLN: 1.0000 g | INTRAVENOUS | Status: DC

## 2021-12-13 MED ORDER — SODIUM CHLORIDE 0.9 % IV SOLN
1.0000 g | Freq: Once | INTRAVENOUS | Status: AC
  Administered 2021-12-13: 1 g via INTRAVENOUS
  Filled 2021-12-13: qty 10

## 2021-12-13 MED ORDER — HEPARIN SODIUM (PORCINE) 5000 UNIT/ML IJ SOLN
5000.0000 [IU] | Freq: Three times a day (TID) | INTRAMUSCULAR | Status: DC
  Administered 2021-12-14: 5000 [IU] via SUBCUTANEOUS
  Filled 2021-12-13: qty 1

## 2021-12-13 MED ORDER — TAMSULOSIN HCL 0.4 MG PO CAPS
0.4000 mg | ORAL_CAPSULE | Freq: Every day | ORAL | Status: DC
  Administered 2021-12-14: 0.4 mg via ORAL
  Filled 2021-12-13: qty 1

## 2021-12-13 MED ORDER — ATORVASTATIN CALCIUM 20 MG PO TABS: 10.0000 mg | ORAL_TABLET | Freq: Every day | ORAL | Status: DC

## 2021-12-13 MED ORDER — SODIUM CHLORIDE 0.9 % IV SOLN: INTRAVENOUS | Status: DC

## 2021-12-13 MED ORDER — VITAMIN D (ERGOCALCIFEROL) 1.25 MG (50000 UNIT) PO CAPS
50000.0000 [IU] | ORAL_CAPSULE | ORAL | Status: DC
  Administered 2021-12-14: 50000 [IU] via ORAL
  Filled 2021-12-13: qty 1

## 2021-12-13 MED ORDER — SODIUM CHLORIDE 0.9% FLUSH
3.0000 mL | Freq: Two times a day (BID) | INTRAVENOUS | Status: DC
  Administered 2021-12-14 (×2): 3 mL via INTRAVENOUS

## 2021-12-13 NOTE — H&P (Signed)
History and Physical    Chief Complaint: Generalized weakness.   HISTORY OF PRESENT ILLNESS: Matthew Goodwin is an 88 y.o. male  generalized weakness that is different from his baseline where he can transfer himself.  [facility] . Also reports of foul smelling urine.    Pt has pmh as below: Past Medical History:  Diagnosis Date   Anemia    BPH (benign prostatic hyperplasia)    Diabetes mellitus without complication ([place])    Dyslipidemia 10/12/2021   Edema    Fall at home, initial encounter 05/06/2019   Hydroureteronephrosis 05/06/2019   Hypertension    Hypoglycemia    Renal insufficiency    Urinary retention    Review of Systems  Unable to perform ROS: Age   No Known Allergies Past Surgical History:  Procedure Laterality Date   unknown       MEDICATIONS: Current Outpatient Medications  Medication Instructions   acetaminophen (TYLENOL) 650 mg, Oral, Every 6 hours PRN   atorvastatin (LIPITOR) 10 mg, Oral, Daily at bedtime   Cholecalciferol 1.25 MG (50000 UT) capsule 1 capsule, Oral, Weekly   docusate sodium (COLACE) 100 mg, Oral, Daily   famotidine (PEPCID) 20 mg, Oral, Nightly   ferrous sulfate 325 mg, Oral, Daily   Fish Oil 1,000 mg, Oral, 2 times daily   fluorometholone (FML) 0.1 % ophthalmic suspension 1 drop, Both Eyes, Daily   levocetirizine (XYZAL) 5 mg, Oral, Daily   losartan (COZAAR) 50 mg, Oral, Daily   metoprolol succinate (TOPROL-XL) 100 mg, Oral, Daily   metoprolol succinate (TOPROL-XL) 50 mg, Oral, Daily   RA Melatonin 10 mg, Oral, Daily at bedtime   tamsulosin (FLOMAX) 0.4 mg, Oral, Daily   Vitamin D (Ergocalciferol) (DRISDOL) 50,000 Units, Oral, Every Wed   ED Course: Pt in Ed pt is A/A and cooperative.  Vitals:   12/13/21 1832 12/13/21 2242 12/13/21 2250  BP: (!) 142/112 (!) 165/104   Pulse: 89 78 85  Resp: 16  18  Temp: 98.7 F (37.1 C)    TempSrc: Oral    SpO2: 100% 98% 100%   No intake/output data  recorded. SpO2: 100 % Blood work in ed shows:abnormal u/a and normal cmp and cbc is stable and esr is 45.  Results for orders placed or performed during the hospital encounter of 12/13/21 (from the past 24 hour(s))  Urinalysis, Routine w reflex microscopic Urine, Clean Catch     Status: Abnormal   Collection Time: 12/13/21  8:04 PM  Result Value Ref Range   Color, Urine YELLOW (A) YELLOW   APPearance CLOUDY (A) CLEAR   Specific Gravity, Urine 1.016 1.005 - 1.030   pH 7.0 5.0 - 8.0   Glucose, UA NEGATIVE NEGATIVE mg/dL   Hgb urine dipstick MODERATE (A) NEGATIVE   Bilirubin Urine NEGATIVE NEGATIVE   Ketones, ur NEGATIVE NEGATIVE mg/dL   Protein, ur 30 (A) NEGATIVE mg/dL   Nitrite NEGATIVE NEGATIVE   Leukocytes,Ua SMALL (A) NEGATIVE   RBC / HPF >50 (H) 0 - 5 RBC/hpf   WBC, UA >50 (H) 0 - 5 WBC/hpf   Bacteria, UA MANY (A) NONE SEEN   Squamous Epithelial / LPF 21-50 0 - 5   WBC Clumps PRESENT    Mucus PRESENT   Basic metabolic panel     Status: Abnormal   Collection Time: 12/13/21  8:14 PM  Result Value Ref Range   Sodium 144 135 - 145 mmol/L   Potassium 4.5 3.5 - 5.1 mmol/L  Chloride 111 98 - 111 mmol/L   CO2 22 22 - 32 mmol/L   Glucose, Bld 136 (H) 70 - 99 mg/dL   BUN 18 8 - 23 mg/dL   Creatinine, Ser 1.05 0.61 - 1.24 mg/dL   Calcium 9.8 8.9 - 10.3 mg/dL   GFR, Estimated >60 >60 mL/min   Anion gap 11 5 - 15  CBC     Status: None   Collection Time: 12/13/21  8:14 PM  Result Value Ref Range   WBC 7.7 4.0 - 10.5 K/uL   RBC 4.55 4.22 - 5.81 MIL/uL   Hemoglobin 13.3 13.0 - 17.0 g/dL   HCT 43.2 39.0 - 52.0 %   MCV 94.9 80.0 - 100.0 fL   MCH 29.2 26.0 - 34.0 pg   MCHC 30.8 30.0 - 36.0 g/dL   RDW 13.0 11.5 - 15.5 %   Platelets 223 150 - 400 K/uL   nRBC 0.0 0.0 - 0.2 %  Troponin I (High Sensitivity)     Status: Abnormal   Collection Time: 12/13/21  8:14 PM  Result Value Ref Range   Troponin I (High Sensitivity) 45 (H) <18 ng/L    Unresulted Labs (From admission, onward)      Start     Ordered   12/13/21 2233  Hepatic function panel  Add-on,   AD        12/13/21 2232   12/13/21 2233  Type and screen  Once,   STAT        12/13/21 2232   12/13/21 2211  Lactic acid, plasma  Now then every 2 hours,   STAT      12/13/21 2211   12/13/21 2210  Blood culture (routine x 2)  BLOOD CULTURE X 2,   STAT      12/13/21 2211           Pt has received : Orders Placed This Encounter  Procedures   Blood culture (routine x 2)    Standing Status:   Standing    Number of Occurrences:   2   Basic metabolic panel    Standing Status:   Standing    Number of Occurrences:   1   CBC    Standing Status:   Standing    Number of Occurrences:   1   Urinalysis, Routine w reflex microscopic    Standing Status:   Standing    Number of Occurrences:   1   Lactic acid, plasma    Standing Status:   Standing    Number of Occurrences:   2   Hepatic function panel    Standing Status:   Standing    Number of Occurrences:   1   Document Height and Actual Weight    Use scales to weigh patient, not stated or estimated weight.    Standing Status:   Standing    Number of Occurrences:   1   Consult to hospitalist    Standing Status:   Standing    Number of Occurrences:   1    Order Specific Question:   Place call to:    Answer:   [id number]    Order Specific Question:   Reason for Consult    Answer:   Admit    Order Specific Question:   Diagnosis/Clinical Info for Consult:    Answer:   UTI, weakness   CBG monitoring, ED    Standing Status:   Standing    Number of Occurrences:  1   ED EKG    Altered mental status    Standing Status:   Standing    Number of Occurrences:   1    Order Specific Question:   Reason for Exam    Answer:   Other (See Comments)   Type and screen    Standing Status:   Standing    Number of Occurrences:   1    Meds ordered this encounter  Medications   sodium chloride 0.9 % bolus 1,000 mL   cefTRIAXone (ROCEPHIN) 1 g in sodium chloride 0.9 % 100  mL IVPB    Order Specific Question:   Antibiotic Indication:    Answer:   Other Indication (list below)    Order Specific Question:   Other Indication:    Answer:   pyelonephritis   pantoprazole (PROTONIX) injection 40 mg    Physical Examination: Vitals:   12/13/21 1832 12/13/21 2242 12/13/21 2250  BP: (!) 142/112 (!) 165/104   Pulse: 89 78 85  Temp: 98.7 F (37.1 C)    Resp: 16  18  SpO2: 100% 98% 100%  TempSrc: Oral     Physical Exam Vitals and nursing note reviewed.  Constitutional:      General: He is not in acute distress.    Appearance: Normal appearance. He is not ill-appearing, toxic-appearing or diaphoretic.  HENT:     Head: Normocephalic and atraumatic.     Right Ear: Hearing and external ear normal.     Left Ear: Hearing and external ear normal.     Nose: Nose normal. No nasal deformity.     Mouth/Throat:     Lips: Pink.     Mouth: Mucous membranes are moist.     Tongue: No lesions.     Pharynx: Oropharynx is clear.  Eyes:     Extraocular Movements: Extraocular movements intact.     Pupils: Pupils are equal, round, and reactive to light.  Cardiovascular:     Rate and Rhythm: Normal rate and regular rhythm.     Pulses: Normal pulses.     Heart sounds: Normal heart sounds.  Pulmonary:     Effort: Pulmonary effort is normal.     Breath sounds: Normal breath sounds.  Abdominal:     General: Bowel sounds are normal. There is no distension.     Palpations: Abdomen is soft. There is no mass.     Tenderness: There is no abdominal tenderness. There is no guarding.     Hernia: No hernia is present.  Musculoskeletal:     Right lower leg: No edema.     Left lower leg: No edema.  Skin:    General: Skin is warm.  Neurological:     General: No focal deficit present.     Cranial Nerves: Cranial nerves 2-12 are intact.     Motor: Motor function is intact.  Psychiatric:        Attention and Perception: Attention normal.        Speech: Speech normal.         Behavior: Behavior is cooperative.        Cognition and Memory: Cognition normal.     Assessment and Plan: * Generalized weakness Attribute to patient's dehydration and underlying urinary tract infection. Will treat the underlying cause, electrolyte abnormalities, PT eval prior to discharge for safety assessment.  Acute lower UTI Urinalysis    Component Value Date/Time   COLORURINE YELLOW (A) 12/13/2021 2004   APPEARANCEUR CLOUDY (A) 12/13/2021 2004  LABSPEC 1.016 12/13/2021 2004   PHURINE 7.0 12/13/2021 2004   GLUCOSEU NEGATIVE 12/13/2021 2004   HGBUR MODERATE (A) 12/13/2021 2004   BILIRUBINUR NEGATIVE 12/13/2021 2004   [place] NEGATIVE 12/13/2021 2004   PROTEINUR 30 (A) 12/13/2021 2004   NITRITE NEGATIVE 12/13/2021 2004   LEUKOCYTESUR SMALL (A) 12/13/2021 2004  Cont rocephin.      DM type 2 (diabetes mellitus, type 2) (HCC) Glycemic protocol. Last A1c of 5.5. No meds on chart.  GERD without esophagitis IV PPI therapy.   Benign essential HTN Vitals:   12/13/21 1832  BP: (!) 142/112  Will cont losartan / metoprolol.   BPH (benign prostatic hyperplasia) Patient has significant history of BPH, TURP, hydroureteronephrosis, recurrent UTIs. Cont rocephin base don previous culture.  Dementia with behavioral disturbance (HCC) Stable. No medications in chart.  Chronic anemia    Latest Ref Rng & Units 12/13/2021    8:14 PM 10/18/2021    6:07 AM 10/17/2021    6:12 AM  CBC  WBC 4.0 - 10.5 K/uL 7.7  5.4  4.0   Hemoglobin 13.0 - 17.0 g/dL 13.3  11.3  11.0   Hematocrit 39.0 - 52.0 % 43.2  34.7  33.7   Platelets 150 - 400 K/uL 223  130  130   Suspect hemoglobin to be falsely elevated secondary to dehydration. Will follow type and screen, IV PPI.     DVT prophylaxis:  SCD's  Code Status:  Full code   Family Communication:  Ashwath, Lasch (Son)  [phone]   Disposition Plan:  TBD   Consults called:  None  Admission status: Observation.   Unit/  Expected LOS: Telemetry / 2 days.    Para Skeans MD [facility]  6 PM- 2 AM. Please contact me via secure Chat 6 PM-2 AM. [phone]( Pager ) To contact the [facility] Attending or Consulting provider [place] or covering provider during after hours [place], for this patient.   Check the care team in [facility] and look for a) attending/consulting [facility] provider listed and b) the [facility] team listed Log into [email or web address] and use [facility]'s universal password to access. If you do not have the password, please contact the hospital operator. Locate the [facility] provider you are looking for under [facility] and page to a number that you can be directly reached. If you still have difficulty reaching the provider, please page the [facility] (Director on Call) for the Hospitalists listed on amion for assistance. [email or web address] 12/13/2021, 10:51 PM

## 2021-12-13 NOTE — Assessment & Plan Note (Signed)
Urinalysis    Component Value Date/Time   COLORURINE YELLOW (A) 12/13/2021 2004   APPEARANCEUR CLOUDY (A) 12/13/2021 2004   LABSPEC 1.016 12/13/2021 2004   PHURINE 7.0 12/13/2021 2004   GLUCOSEU NEGATIVE 12/13/2021 2004   HGBUR MODERATE (A) 12/13/2021 2004   BILIRUBINUR NEGATIVE 12/13/2021 2004   KETONESUR NEGATIVE 12/13/2021 2004   PROTEINUR 30 (A) 12/13/2021 2004   NITRITE NEGATIVE 12/13/2021 2004   LEUKOCYTESUR SMALL (A) 12/13/2021 2004  Cont rocephin.

## 2021-12-13 NOTE — ED Notes (Signed)
Pt assisted with a urinal; pt was double brief; both were soiled. Pt changed by this RN & EDT [place] Laundry). Urine specimen & blood obtained, labeled at the bedside, and sent to the lab.

## 2021-12-13 NOTE — ED Triage Notes (Signed)
Pt comes via EMS from [facility] with c/o possible UTI. Pt has had increased weakness. Pt has foul odor. Pt unable to transfer like he normally does.  185/111 80-hr 100 % RA CBG-117

## 2021-12-13 NOTE — Assessment & Plan Note (Signed)
Patient has significant history of BPH, TURP, hydroureteronephrosis, recurrent UTIs. Cont rocephin base don previous culture.

## 2021-12-13 NOTE — Assessment & Plan Note (Signed)
IV PPI therapy.  

## 2021-12-13 NOTE — Assessment & Plan Note (Signed)
Glycemic protocol. Last A1c of 5.5. No meds on chart.

## 2021-12-13 NOTE — ED Provider Notes (Signed)
[facility] Provider Note    Event Date/Time   First MD Initiated Contact with Patient 12/13/21 2147     (approximate)   History   Weakness   HPI  Matthew Goodwin is a 88 y.o. male past medical history significant for dementia who presents to the emergency department for generalized weakness and concern for urinary tract affection coming from his facility.  States that he had foul-smelling urine and worsening weakness and difficulty transferring like he normally would at his facility.  No known falls or trauma.  Patient just states that he feels okay.     Physical Exam   Triage Vital Signs: ED Triage Vitals  Enc Vitals Group     BP 12/13/21 1832 (!) 142/112     Pulse Rate 12/13/21 1832 89     Resp 12/13/21 1832 16     Temp 12/13/21 1832 98.7 F (37.1 C)     Temp Source 12/13/21 1832 Oral     SpO2 12/13/21 1832 100 %     Weight --      Height --      Head Circumference --      Peak Flow --      Pain Score 12/13/21 1828 0     Pain Loc --      Pain Edu? --      Excl. in GC? --     Most recent vital signs: Vitals:   12/13/21 1832  BP: (!) 142/112  Pulse: 89  Resp: 16  Temp: 98.7 F (37.1 C)  SpO2: 100%    Physical Exam Constitutional:      Appearance: He is well-developed.  HENT:     Head:     Comments: Dry mucous membranes Eyes:     Conjunctiva/sclera: Conjunctivae normal.  Cardiovascular:     Rate and Rhythm: Regular rhythm.  Pulmonary:     Effort: No respiratory distress.  Abdominal:     General: There is no distension.     Tenderness: There is no abdominal tenderness.  Musculoskeletal:        General: Normal range of motion.     Cervical back: Normal range of motion.  Skin:    General: Skin is warm.     Capillary Refill: Capillary refill takes 2 to 3 seconds.  Neurological:     General: No focal deficit present.     Mental Status: He is alert. Mental status is at baseline.     IMPRESSION / MDM / ASSESSMENT AND PLAN  / ED COURSE  I reviewed the triage vital signs and the nursing notes.  Differential diagnosis including dehydration, urinary tract infection, ACS, electrolyte abnormality  EKG  I, Corena Herter, the attending physician, personally viewed and interpreted this ECG.   Rate: Normal  Rhythm: Normal sinus  Axis: Normal  Intervals: Normal  ST&T Change: Flipped T waves to the lateral leads with mild ST depression.  No significant change when compared to prior EKG. Atrial fibrillation read on prior EKG  No tachycardic or bradycardic dysrhythmias while on cardiac telemetry.   Labs (all labs ordered are listed, but only abnormal results are displayed) Labs interpreted as -  No significant leukocytosis.  Troponin mildly elevated.  UA with findings concerning for urinary tract infection.  Creatinine appears to be at his baseline.  No significant electrolyte abnormalities.  Labs Reviewed  BASIC METABOLIC PANEL - Abnormal; Notable for the following components:      Result Value   Glucose, Bld  136 (*)    All other components within normal limits  URINALYSIS, ROUTINE W REFLEX MICROSCOPIC - Abnormal; Notable for the following components:   Color, Urine YELLOW (*)    APPearance CLOUDY (*)    Hgb urine dipstick MODERATE (*)    Protein, ur 30 (*)    Leukocytes,Ua SMALL (*)    RBC / HPF >50 (*)    WBC, UA >50 (*)    Bacteria, UA MANY (*)    All other components within normal limits  TROPONIN I (HIGH SENSITIVITY) - Abnormal; Notable for the following components:   Troponin I (High Sensitivity) 45 (*)    All other components within normal limits  CULTURE, BLOOD (ROUTINE X 2)  CULTURE, BLOOD (ROUTINE X 2)  CBC  LACTIC ACID, PLASMA  LACTIC ACID, PLASMA  CBG MONITORING, ED  TROPONIN I (HIGH SENSITIVITY)      EKG without significant change compared to prior.  Troponin mildly elevated no chest pain at this time will do serial troponins but have a lower suspicion for ACS.  Patient given IV  fluids.  Will obtain blood cultures and a lactic acid given his generalized weakness.  Appears clinically dehydrated will give IV fluids.  Consulted hospitalist for admission for weakness and a urinary tract infection.   PROCEDURES:  Critical Care performed: No  Procedures  Patient's presentation is most consistent with acute presentation with potential threat to life or bodily function.   MEDICATIONS ORDERED IN ED: Medications  sodium chloride 0.9 % bolus 1,000 mL (has no administration in time range)  cefTRIAXone (ROCEPHIN) 1 g in sodium chloride 0.9 % 100 mL IVPB (has no administration in time range)    FINAL CLINICAL IMPRESSION(S) / ED DIAGNOSES   Final diagnoses:  Weakness  Dehydration  Lower urinary tract infectious disease     Rx / DC Orders   ED Discharge Orders     None        Note:  This document was prepared using Dragon voice recognition software and may include unintentional dictation errors.   Corena Herter, MD 12/13/21 2219

## 2021-12-13 NOTE — Assessment & Plan Note (Signed)
Attribute to patient's dehydration and underlying urinary tract infection. Will treat the underlying cause, electrolyte abnormalities, PT eval prior to discharge for safety assessment.

## 2021-12-13 NOTE — Assessment & Plan Note (Signed)
Stable. No medications in chart.

## 2021-12-13 NOTE — Assessment & Plan Note (Signed)
    Latest Ref Rng & Units 12/13/2021    8:14 PM 10/18/2021    6:07 AM 10/17/2021    6:12 AM  CBC  WBC 4.0 - 10.5 K/uL 7.7  5.4  4.0   Hemoglobin 13.0 - 17.0 g/dL 94.7  07.6  15.1   Hematocrit 39.0 - 52.0 % 43.2  34.7  33.7   Platelets 150 - 400 K/uL 223  130  130   Suspect hemoglobin to be falsely elevated secondary to dehydration. Will follow type and screen, IV PPI.

## 2021-12-13 NOTE — Assessment & Plan Note (Signed)
Vitals:   12/13/21 1832  BP: (!) 142/112  Will cont losartan / metoprolol.

## 2021-12-14 DIAGNOSIS — R531 Weakness: Secondary | ICD-10-CM | POA: Diagnosis not present

## 2021-12-14 LAB — COMPREHENSIVE METABOLIC PANEL
ALT: 14 U/L (ref 0–44)
AST: 24 U/L (ref 15–41)
Albumin: 3.3 g/dL — ABNORMAL LOW (ref 3.5–5.0)
Alkaline Phosphatase: 58 U/L (ref 38–126)
Anion gap: 4 — ABNORMAL LOW (ref 5–15)
BUN: 16 mg/dL (ref 8–23)
CO2: 24 mmol/L (ref 22–32)
Calcium: 8.9 mg/dL (ref 8.9–10.3)
Chloride: 117 mmol/L — ABNORMAL HIGH (ref 98–111)
Creatinine, Ser: 0.94 mg/dL (ref 0.61–1.24)
GFR, Estimated: >60 mL/min (ref >60)
Glucose, Bld: 92 mg/dL (ref 70–99)
Potassium: 4.3 mmol/L (ref 3.5–5.1)
Sodium: 145 mmol/L (ref 135–145)
Total Bilirubin: 0.5 mg/dL (ref 0.3–1.2)
Total Protein: 6.4 g/dL — ABNORMAL LOW (ref 6.5–8.1)

## 2021-12-14 LAB — CBC
HCT: 38.9 % — ABNORMAL LOW (ref 39.0–52.0)
Hemoglobin: 12.1 g/dL — ABNORMAL LOW (ref 13.0–17.0)
MCH: 29.2 pg (ref 26.0–34.0)
MCHC: 31.1 g/dL (ref 30.0–36.0)
MCV: 94 fL (ref 80.0–100.0)
Platelets: 174 10*3/uL (ref 150–400)
RBC: 4.14 MIL/uL — ABNORMAL LOW (ref 4.22–5.81)
RDW: 13 % (ref 11.5–15.5)
WBC: 6.4 10*3/uL (ref 4.0–10.5)
nRBC: 0 % (ref 0.0–0.2)

## 2021-12-14 LAB — HEPATIC FUNCTION PANEL
ALT: 14 U/L (ref 0–44)
AST: 28 U/L (ref 15–41)
Albumin: 3.7 g/dL (ref 3.5–5.0)
Alkaline Phosphatase: 61 U/L (ref 38–126)
Bilirubin, Direct: 0.2 mg/dL (ref 0.0–0.2)
Indirect Bilirubin: 0.5 mg/dL (ref 0.3–0.9)
Total Bilirubin: 0.7 mg/dL (ref 0.3–1.2)
Total Protein: 7.1 g/dL (ref 6.5–8.1)

## 2021-12-14 LAB — LACTIC ACID, PLASMA: Lactic Acid, Venous: <0.3 mmol/L — ABNORMAL LOW (ref 0.5–1.9)

## 2021-12-14 MED ORDER — FOSFOMYCIN TROMETHAMINE 3 G PO PACK
3.0000 g | PACK | Freq: Once | ORAL | Status: AC
  Administered 2021-12-14: 3 g via ORAL
  Filled 2021-12-14: qty 3

## 2021-12-14 NOTE — ED Notes (Signed)
Hospitalist evaluated pt at bedside. Pt was asleep and was able to wake up with stimulation. Pt was not able to give much information about symptoms. Plan is to give 10am meds and 1 time dose of fosfomycin to treat UTI, then discharge back to facility.

## 2021-12-14 NOTE — ED Notes (Signed)
Caseworker is assisting with discharge planning and will d/c pt once all in place with EMS transport.

## 2021-12-14 NOTE — ED Notes (Addendum)
Pt asleep. Vitals stable. Fluids running at 50 mL/hr. Pt Matthew Goodwin on 2L.

## 2021-12-14 NOTE — ED Notes (Signed)
Peri care preformed on pt, bowel movement and urine present on time of care. Pt male purewick applied. New sheets and blanket on bed. Pt resting in bed.

## 2021-12-14 NOTE — ED Notes (Signed)
EMS to transport pt to Matthew Goodwin

## 2021-12-14 NOTE — Hospital Course (Addendum)
Taken from prior notes.  Matthew Goodwin is a 88 y.o. male past medical history significant for dementia who presents to the emergency department for generalized weakness and concern for urinary tract affection coming from his facility.  States that he had foul-smelling urine and worsening weakness and difficulty transferring like he normally would at his facility.  No known falls or trauma.  Patient just states that he feels okay.   ED course.  Patient was afebrile with stable vitals.  Labs pertinent for troponin 45>>44, CBC and CMP stable, lactic acid at 2 >>0.3.  Blood cultures were drawn and patient was started on ceftriaxone.  12/13: Hemodynamically stable.  Labs seems stable and consistent with IV hydration. Preliminary blood cultures negative.  Urine cultures were never ordered-adding as an add-on as patient is already on ceftriaxone. Prior positive urine culture in 2020 with E. coli, resistant to ampicillin, sulbactam, Cipro and gentamicin.  Patient was given a dose of fosfomycin.  Patient is stable to go back to his facility and resume his routine medications.  Patient will continue with his current medications and need to have a close follow-up with his PCP for further recommendations.

## 2021-12-14 NOTE — TOC Initial Note (Signed)
Transition of Care [facility]) - Initial/Assessment Note    Patient Details  Name: Matthew Goodwin MRN: [id number] Date of Birth: 10-26-1920  [facility] CM/SW Contact:    Allayne Butcher, RN Phone Number: 12/14/2021, 1:17 PM  Clinical Narrative:                 Patient placed under observation for generalized weakness and treated for UTI.  Patient has been medically cleared for discharge back to his assisted living facility.  RNCM spoke with Achilles Dunk, ALF owner to notify him of DC today.  Clement requests EMS transport for patient as he reports it is very difficult for patient to get in and out of a vehicle.  ED secretary will arrange EMS transport.  Attempted to call son to notify of discharge but went to voicemail.   Expected Discharge Plan: Assisted Living Barriers to Discharge: Barriers Resolved   Patient Goals and CMS Choice        Expected Discharge Plan and Services Expected Discharge Plan: Assisted Living   Discharge Planning Services: CM Consult   Living arrangements for the past 2 months: Assisted Living Facility Expected Discharge Date: 12/14/21               DME Arranged: N/A DME Agency: NA       HH Arranged: NA HH Agency: NA        Prior Living Arrangements/Services Living arrangements for the past 2 months: Assisted Living Facility Lives with:: Facility Resident Patient language and need for interpreter reviewed:: Yes        Need for Family Participation in Patient Care: Yes (Comment) Care giver support system in place?: Yes (comment) Current home services: DME Criminal Activity/Legal Involvement Pertinent to Current Situation/Hospitalization: No - Comment as needed  Activities of Daily Living      Permission Sought/Granted         Permission granted to share info w AGENCY: [facility]        Emotional Assessment       Orientation: : Oriented to Self Alcohol / Substance Use: Not Applicable Psych Involvement: No  (comment)  Admission diagnosis:  UTI (urinary tract infection) [N39.0] Patient Active Problem List   Diagnosis Date Noted   Generalized weakness 12/13/2021   UTI (urinary tract infection) 12/13/2021   Chronic anemia 10/16/2021   Dementia with behavioral disturbance (HCC) 10/16/2021   GERD without esophagitis 10/12/2021   BPH (benign prostatic hyperplasia) 10/12/2021   Closed compression fracture of L2 vertebra (HCC) 05/07/2019   Back pain 05/06/2019   Closed compression fracture of body of L1 vertebra (HCC) 05/06/2019   Acute lower UTI 05/06/2019   DM type 2 (diabetes mellitus, type 2) (HCC) 05/06/2019   Aortic stenosis 05/06/2019   Benign essential HTN 05/06/2019   PCP:  Smiley Houseman, NP Pharmacy:   [facility] - [place], [place] - [address] [place] [address] Phone: [phone] Fax: [phone]     Social Determinants of Health (SDOH) Interventions    Readmission Risk Interventions     No data to display

## 2021-12-14 NOTE — [place] FL2 (Signed)
[place] MEDICAID FL2 LEVEL OF CARE FORM     IDENTIFICATION  Patient Name: Matthew Goodwin Birthdate: 12-30-1920 Sex: male Admission Date (Current Location): 12/13/2021  [facility] and [place] Number:  Chiropodist and Address:  [facility], [address] [place], [place] [address]      Provider Number: [id number]  Attending Physician Name and Address:  Arnetha Courser, MD  Relative Name and Phone Number:  Kamrin Sibley- son- [phone]    Current Level of Care: Hospital Recommended Level of Care: Assisted Living Facility Prior Approval Number:    Date Approved/Denied:   PASRR Number:    Discharge Plan: Other (Comment) (ALF)    Current Diagnoses: Patient Active Problem List   Diagnosis Date Noted   Generalized weakness 12/13/2021   UTI (urinary tract infection) 12/13/2021   Chronic anemia 10/16/2021   Dementia with behavioral disturbance (HCC) 10/16/2021   GERD without esophagitis 10/12/2021   BPH (benign prostatic hyperplasia) 10/12/2021   Closed compression fracture of L2 vertebra (HCC) 05/07/2019   Back pain 05/06/2019   Closed compression fracture of body of L1 vertebra (HCC) 05/06/2019   Acute lower UTI 05/06/2019   DM type 2 (diabetes mellitus, type 2) (HCC) 05/06/2019   Aortic stenosis 05/06/2019   Benign essential HTN 05/06/2019    Orientation RESPIRATION BLADDER Height & Weight     Self  Normal Incontinent Weight:   Height:     BEHAVIORAL SYMPTOMS/MOOD NEUROLOGICAL BOWEL NUTRITION STATUS      Incontinent Diet (see discharge summary)  AMBULATORY STATUS COMMUNICATION OF NEEDS Skin   Extensive Assist Verbally Normal                       Personal Care Assistance Level of Assistance  Bathing, Feeding, Dressing Bathing Assistance: Limited assistance Feeding assistance: Limited assistance Dressing Assistance: Limited assistance     Functional Limitations Info             SPECIAL CARE FACTORS FREQUENCY                        Contractures Contractures Info: Not present    Additional Factors Info  Code Status, Allergies Code Status Info: Full Allergies Info: NKA           Current Medications (12/14/2021):  This is the current hospital active medication list Current Facility-Administered Medications  Medication Dose Route Frequency Provider Last Rate Last Admin   0.9 %  sodium chloride infusion   Intravenous Continuous Gertha Calkin, MD 50 mL/hr at 12/14/21 0938 Rate Verify at 12/14/21 3154   acetaminophen (TYLENOL) tablet 650 mg  650 mg Oral Q6H PRN Gertha Calkin, MD       Or   acetaminophen (TYLENOL) suppository 650 mg  650 mg Rectal Q6H PRN Gertha Calkin, MD       atorvastatin (LIPITOR) tablet 10 mg  10 mg Oral QHS Gertha Calkin, MD       cefTRIAXone (ROCEPHIN) 1 g in sodium chloride 0.9 % 100 mL IVPB  1 g Intravenous Q24H Irena Cords V, MD       fluorometholone (FML) 0.1 % ophthalmic suspension 1 drop  1 drop Both Eyes Daily Gertha Calkin, MD       heparin injection 5,000 Units  5,000 Units Subcutaneous Q8H Gertha Calkin, MD   5,000 Units at 12/14/21 0110   HYDROcodone-acetaminophen (NORCO/VICODIN) 5-325 MG per tablet 1 tablet  1 tablet  Oral Q4H PRN Gertha Calkin, MD       losartan (COZAAR) tablet 50 mg  50 mg Oral Daily Irena Cords V, MD   50 mg at 12/14/21 1028   morphine (PF) 2 MG/ML injection 2 mg  2 mg Intravenous Q4H PRN Gertha Calkin, MD       pantoprazole (PROTONIX) injection 40 mg  40 mg Intravenous Q12H Irena Cords V, MD   40 mg at 12/14/21 1035   sodium chloride flush (NS) 0.9 % injection 3 mL  3 mL Intravenous Q12H Irena Cords V, MD   3 mL at 12/14/21 1035   tamsulosin (FLOMAX) capsule 0.4 mg  0.4 mg Oral Daily Irena Cords V, MD   0.4 mg at 12/14/21 1031   Vitamin D (Ergocalciferol) (DRISDOL) 1.25 MG (50000 UNIT) capsule 50,000 Units  50,000 Units Oral Q Charise Carwin, MD   50,000 Units at 12/14/21 1031   Current Outpatient Medications  Medication Sig Dispense  Refill   atorvastatin (LIPITOR) 10 MG tablet Take 10 mg by mouth at bedtime.     famotidine (PEPCID) 20 MG tablet Take 20 mg by mouth at bedtime.     losartan (COZAAR) 50 MG tablet Take 50 mg by mouth daily.      acetaminophen (TYLENOL) 325 MG tablet Take 650 mg by mouth every 6 (six) hours as needed for mild pain or moderate pain.     docusate sodium (COLACE) 100 MG capsule Take 100 mg by mouth daily.     ferrous sulfate 325 (65 FE) MG tablet Take 325 mg by mouth daily.     fluorometholone (FML) 0.1 % ophthalmic suspension Place 1 drop into both eyes daily.     levocetirizine (XYZAL) 5 MG tablet Take 5 mg by mouth daily.      metoprolol succinate (TOPROL-XL) 50 MG 24 hr tablet Take 50 mg by mouth daily.     Omega-3 Fatty Acids (FISH OIL) 1000 MG CAPS Take 1,000 mg by mouth 2 (two) times daily.     RA MELATONIN 10 MG TABS Take 10 mg by mouth at bedtime.     tamsulosin (FLOMAX) 0.4 MG CAPS capsule Take 0.4 mg by mouth daily.      Vitamin D, Ergocalciferol, (DRISDOL) 1.25 MG (50000 UNIT) CAPS capsule Take 50,000 Units by mouth every Wednesday.       Discharge Medications: Medication List       STOP taking these medications     Cholecalciferol 1.25 MG (50000 UT) capsule           TAKE these medications     acetaminophen 325 MG tablet Commonly known as: TYLENOL Take 650 mg by mouth every 6 (six) hours as needed for mild pain or moderate pain.    atorvastatin 10 MG tablet Commonly known as: LIPITOR Take 10 mg by mouth at bedtime.    docusate sodium 100 MG capsule Commonly known as: COLACE Take 100 mg by mouth daily.    famotidine 20 MG tablet Commonly known as: PEPCID Take 20 mg by mouth at bedtime.    ferrous sulfate 325 (65 FE) MG tablet Take 325 mg by mouth daily.    Fish Oil 1000 MG Caps Take 1,000 mg by mouth 2 (two) times daily.    fluorometholone 0.1 % ophthalmic suspension Commonly known as: FML Place 1 drop into both eyes daily.    levocetirizine 5 MG  tablet Commonly known as: XYZAL Take 5 mg by mouth daily.  losartan 50 MG tablet Commonly known as: COZAAR Take 50 mg by mouth daily.    metoprolol succinate 50 MG 24 hr tablet Commonly known as: TOPROL-XL Take 50 mg by mouth daily. What changed: Another medication with the same name was removed. Continue taking this medication, and follow the directions you see here.    RA Melatonin 10 MG Tabs Generic drug: Melatonin Take 10 mg by mouth at bedtime.    tamsulosin 0.4 MG Caps capsule Commonly known as: FLOMAX Take 0.4 mg by mouth daily.    Vitamin D (Ergocalciferol) 1.25 MG (50000 UNIT) Caps capsule Commonly known as: DRISDOL Take 50,000 Units by mouth every Wednesday.        Relevant Imaging Results:  Relevant Lab Results:   Additional Information SS#: [id number]  Allayne Butcher, RN

## 2021-12-14 NOTE — ED Notes (Signed)
Pt sleeping with blanket over head does not want to wake up.

## 2021-12-14 NOTE — Discharge Summary (Signed)
Physician Discharge Summary   Patient: Matthew Goodwin MRN: [id number] DOB: 06/29/1920  Admit date:     12/13/2021  Discharge date: 12/14/21  Discharge Physician: Arnetha Courser   PCP: Smiley Houseman, NP   Recommendations at discharge:  Follow-up with primary care provider Follow-up final urine and blood culture results  Discharge Diagnoses: Principal Problem:   Generalized weakness Active Problems:   Acute lower UTI   DM type 2 (diabetes mellitus, type 2) (HCC)   GERD without esophagitis   Benign essential HTN   BPH (benign prostatic hyperplasia)   Dementia with behavioral disturbance (HCC)   Chronic anemia   UTI (urinary tract infection)   Hospital Course: Taken from prior notes.  Matthew Goodwin is a 88 y.o. male past medical history significant for dementia who presents to the emergency department for generalized weakness and concern for urinary tract affection coming from his facility.  States that he had foul-smelling urine and worsening weakness and difficulty transferring like he normally would at his facility.  No known falls or trauma.  Patient just states that he feels okay.   ED course.  Patient was afebrile with stable vitals.  Labs pertinent for troponin 45>>44, CBC and CMP stable, lactic acid at 2 >>0.3.  Blood cultures were drawn and patient was started on ceftriaxone.  12/13: Hemodynamically stable.  Labs seems stable and consistent with IV hydration. Preliminary blood cultures negative.  Urine cultures were never ordered-adding as an add-on as patient is already on ceftriaxone. Prior positive urine culture in 2020 with E. coli, resistant to ampicillin, sulbactam, Cipro and gentamicin.  Patient was given a dose of fosfomycin.  Patient is stable to go back to his facility and resume his routine medications.  Patient will continue with his current medications and need to have a close follow-up with his PCP for further recommendations.  Assessment and Plan: *  Generalized weakness Attribute to patient's dehydration and underlying urinary tract infection. Will treat the underlying cause, electrolyte abnormalities, PT eval prior to discharge for safety assessment.  Acute lower UTI Urinalysis    Component Value Date/Time   COLORURINE YELLOW (A) 12/13/2021 2004   APPEARANCEUR CLOUDY (A) 12/13/2021 2004   LABSPEC 1.016 12/13/2021 2004   PHURINE 7.0 12/13/2021 2004   GLUCOSEU NEGATIVE 12/13/2021 2004   HGBUR MODERATE (A) 12/13/2021 2004   BILIRUBINUR NEGATIVE 12/13/2021 2004   KETONESUR NEGATIVE 12/13/2021 2004   PROTEINUR 30 (A) 12/13/2021 2004   NITRITE NEGATIVE 12/13/2021 2004   LEUKOCYTESUR SMALL (A) 12/13/2021 2004  Cont rocephin.      DM type 2 (diabetes mellitus, type 2) (HCC) Glycemic protocol. Last A1c of 5.5. No meds on chart.  GERD without esophagitis IV PPI therapy.   Benign essential HTN Vitals:   12/13/21 1832  BP: (!) 142/112  Will cont losartan / metoprolol.   BPH (benign prostatic hyperplasia) Patient has significant history of BPH, TURP, hydroureteronephrosis, recurrent UTIs. Cont rocephin base don previous culture.  Dementia with behavioral disturbance (HCC) Stable. No medications in chart.  Chronic anemia    Latest Ref Rng & Units 12/13/2021    8:14 PM 10/18/2021    6:07 AM 10/17/2021    6:12 AM  CBC  WBC 4.0 - 10.5 K/uL 7.7  5.4  4.0   Hemoglobin 13.0 - 17.0 g/dL 33.8  25.0  53.9   Hematocrit 39.0 - 52.0 % 43.2  34.7  33.7   Platelets 150 - 400 K/uL 223  130  130   Suspect hemoglobin to  be falsely elevated secondary to dehydration. Will follow type and screen, IV PPI.     Consultants: None Procedures performed: None Disposition: Skilled nursing facility Diet recommendation:  Discharge Diet Orders (From admission, onward)     Start     Ordered   12/14/21 0000  Diet - low sodium heart healthy        12/14/21 1129           Regular diet DISCHARGE MEDICATION: Allergies as of  12/14/2021   No Known Allergies      Medication List     STOP taking these medications    Cholecalciferol 1.25 MG (50000 UT) capsule       TAKE these medications    acetaminophen 325 MG tablet Commonly known as: TYLENOL Take 650 mg by mouth every 6 (six) hours as needed for mild pain or moderate pain.   atorvastatin 10 MG tablet Commonly known as: LIPITOR Take 10 mg by mouth at bedtime.   docusate sodium 100 MG capsule Commonly known as: COLACE Take 100 mg by mouth daily.   famotidine 20 MG tablet Commonly known as: PEPCID Take 20 mg by mouth at bedtime.   ferrous sulfate 325 (65 FE) MG tablet Take 325 mg by mouth daily.   Fish Oil 1000 MG Caps Take 1,000 mg by mouth 2 (two) times daily.   fluorometholone 0.1 % ophthalmic suspension Commonly known as: FML Place 1 drop into both eyes daily.   levocetirizine 5 MG tablet Commonly known as: XYZAL Take 5 mg by mouth daily.   losartan 50 MG tablet Commonly known as: COZAAR Take 50 mg by mouth daily.   metoprolol succinate 50 MG 24 hr tablet Commonly known as: TOPROL-XL Take 50 mg by mouth daily. What changed: Another medication with the same name was removed. Continue taking this medication, and follow the directions you see here.   RA Melatonin 10 MG Tabs Generic drug: Melatonin Take 10 mg by mouth at bedtime.   tamsulosin 0.4 MG Caps capsule Commonly known as: FLOMAX Take 0.4 mg by mouth daily.   Vitamin D (Ergocalciferol) 1.25 MG (50000 UNIT) Caps capsule Commonly known as: DRISDOL Take 50,000 Units by mouth every Wednesday.        Follow-up Information     Smiley Houseman, NP. Schedule an appointment as soon as possible for a visit in 1 week(s).   Specialty: Family Medicine Contact information: [address] [place] [address] [phone]                Discharge Exam: There were no vitals filed for this visit. General.  Frail elderly man, in no acute distress. Pulmonary.   Lungs clear bilaterally, normal respiratory effort. CV.  Regular rate and rhythm, no JVD, rub or murmur. Abdomen.  Soft, nontender, nondistended, BS positive. CNS.  Alert and oriented .  No focal neurologic deficit. Extremities.  No edema, no cyanosis, pulses intact and symmetrical.  Dry skin Psychiatry.  Judgment and insight appears impaired  Condition at discharge: stable  The results of significant diagnostics from this hospitalization (including imaging, microbiology, ancillary and laboratory) are listed below for reference.   Imaging Studies: No results found.  Microbiology: Results for orders placed or performed during the hospital encounter of 12/13/21  Blood culture (routine x 2)     Status: None (Preliminary result)   Collection Time: 12/13/21 10:34 PM   Specimen: BLOOD  Result Value Ref Range Status   Specimen Description BLOOD LEFT ANTECUBITAL  Final  Special Requests   Final    BOTTLES DRAWN AEROBIC AND ANAEROBIC Blood Culture results may not be optimal due to an inadequate volume of blood received in culture bottles   Culture   Final    NO GROWTH < 12 HOURS Performed at [facility], [address], [place], [place] [address]    Report Status PENDING  Incomplete  Blood culture (routine x 2)     Status: None (Preliminary result)   Collection Time: 12/13/21 10:34 PM   Specimen: BLOOD  Result Value Ref Range Status   Specimen Description BLOOD BLOOD LEFT FOREARM  Final   Special Requests   Final    BOTTLES DRAWN AEROBIC AND ANAEROBIC Blood Culture results may not be optimal due to an inadequate volume of blood received in culture bottles   Culture   Final    NO GROWTH < 12 HOURS Performed at [facility], [address] [place], [place] [address]    Report Status PENDING  Incomplete    Labs: CBC: Recent Labs  Lab 12/13/21 2014 12/14/21 0536  WBC 7.7 6.4  HGB 13.3 12.1*  HCT 43.2 38.9*  MCV 94.9 94.0  PLT 223 174   Basic Metabolic  Panel: Recent Labs  Lab 12/13/21 2014 12/14/21 0536  NA 144 145  K 4.5 4.3  CL 111 117*  CO2 22 24  GLUCOSE 136* 92  BUN 18 16  CREATININE 1.05 0.94  CALCIUM 9.8 8.9   Liver Function Tests: Recent Labs  Lab 12/13/21 2210 12/14/21 0536  AST 28 24  ALT 14 14  ALKPHOS 61 58  BILITOT 0.7 0.5  PROT 7.1 6.4*  ALBUMIN 3.7 3.3*   CBG: No results for input(s): "GLUCAP" in the last 168 hours.  Discharge time spent: greater than 30 minutes.  This record has been created using Conservation officer, historic buildings. Errors have been sought and corrected,but may not always be located. Such creation errors do not reflect on the standard of care.   Signed: Arnetha Courser, MD [facility] 12/14/2021

## 2021-12-14 NOTE — ED Notes (Signed)
Pt awake and alert to self. Pt took all morning meds with no trouble and tolerated well.

## 2021-12-15 DIAGNOSIS — R531 Weakness: Secondary | ICD-10-CM | POA: Diagnosis not present

## 2021-12-16 LAB — URINE CULTURE: Culture: >=100000 — AB

## 2021-12-18 LAB — CULTURE, BLOOD (ROUTINE X 2)
Culture: NO GROWTH
Culture: NO GROWTH

## 2022-03-01 ENCOUNTER — Non-Acute Institutional Stay: Payer: Medicare Other | Admitting: Nurse Practitioner

## 2022-03-01 ENCOUNTER — Encounter: Payer: Self-pay | Admitting: Nurse Practitioner

## 2022-03-01 DIAGNOSIS — R531 Weakness: Secondary | ICD-10-CM

## 2022-03-01 DIAGNOSIS — R634 Abnormal weight loss: Secondary | ICD-10-CM

## 2022-03-01 DIAGNOSIS — F03918 Unspecified dementia, unspecified severity, with other behavioral disturbance: Secondary | ICD-10-CM

## 2022-03-01 DIAGNOSIS — Z515 Encounter for palliative care: Secondary | ICD-10-CM

## 2022-03-01 NOTE — Progress Notes (Signed)
[facility] Pall[facility] Consult Note Telephone: [phone]  Fax: [phone]    Date of encounter: 03/01/22 5:21 PM PATIENT NAME: Matthew Goodwin [id number]   [phone] (home)  DOB: [birth year] MRN: [id number] PRIMARY CARE PROVIDER:   [name] Belva Agee, NP,  [place] [address] [phone]  RESPONSIBLE PARTY:    Contact Information     Name Relation Home Work [place] Son   [phone]   [name] Other [phone]  [phone]     I met face to face with patient in facility. Palliative Care was asked to follow this patient by consultation request of  Casilda Carls, MD to address advance care planning and complex medical decision making. This is a follow up visit.  ASSESSMENT AND PLAN / RECOMMENDATIONS:  Symptom Management/Plan: 1. Advance Care Planning;  DNR 2. Goals of Care: Goals include to maximize quality of life and symptom management. Our advance care planning conversation included a discussion about:    The value and importance of advance care planning  Exploration of personal, cultural or spiritual beliefs that might influence medical decisions  Exploration of goals of care in the event of a sudden injury or illness  Identification and preparation of a healthcare agent  Review and updating or creation of an advance directive document. 3. Palliative care encounter; Palliative care encounter; Palliative medicine team will continue to support patient, patient's family, and medical team. Visit consisted of counseling and education dealing with the complex and emotionally intense issues of symptom management and palliative care in the setting of serious and potentially life-threatening illness   4. weight loss with generalized weakness secondary to dementia- W/C used for mobility. Discussed with Matthew Goodwin falls/safety. Caregivers  to continue assisting with adl's. Reviewed and discussed weights, nutritional counseling and education provided to Matthew Goodwin, supplements as able. Fall precautions 09/09/2019 weight 190 lbs 03/01/2022 weight 131.2 lbs  58.8 lbs/4 years; 30.95% Follow up Palliative Care Visit: PC f/u visit further discussion monitor trends of appetite, weights, monitor for functional, cognitive decline with chronic disease progression, assess any active symptoms, supportive role. Palliative care will continue to follow for complex medical decision making, advance care planning, and clarification of goals. Return in 6-8 weeks or prn. PPS: 40% Chief Complaint: Follow up palliative consult for complex medical decision making, address goals, manage ongoing symptoms   I spent 41 minutes providing this consultation. More than 50% of the time in this consultation was spent in counseling and care coordination.    HISTORY OF PRESENT ILLNESS:  Malak Tremel is a 89 y.o. year old male  with dementia with behavioral disturbance, BPH, T2DM, essential hypertension, GERD, chronic anemia, hx of L1 compression fracture. I visited and observed Matthew Goodwin currently residing at [facility]. Purpose of today PC f/u visit further discussion monitor trends of appetite, weights, monitor for functional, cognitive decline with chronic disease progression, assess any active symptoms, supportive role. Matthew Goodwin was sitting in the w/c at the dining room table feeding himself finishing up lunch. No s/s aspiration, coughing. Discussed with staff Matthew Goodwin requires assistance with mobility, transfers, adl's. Matthew Goodwin feeds himself with fairly good appetite. No recent falls, wounds. Matthew Goodwin and I talked about purpose of pc visit. We talked about ros, functional abilities, stressed importance of being oob,  propelling with his feet. Got Matthew Goodwin to stand on the scale to weight with  weight 131.2 lbs, unsteady gait and balance. Took 2 staff  members to stand him up. We talked about residing at facility, appetite, foods that he enjoys.Discussions limited with cognitive impairment. Matthew Goodwin appears stable currently, medical goals, goc, medications reviewed, no new changes recommended today. Therapeutic listening, emotional support provided. Questions answered. Updated staff.    History obtained from review of EMR, discussion with primary team, and interview with family, facility staff/caregiver and/or Matthew. Goodwin.  I reviewed available labs, medications, imaging, studies and related documents from the EMR.  Records reviewed and summarized above.  Physical Exam: General: frail appearing, thin, engaging male ENMT: oral mucous membranes moist CV: S1S2, RRR Pulmonary: LCTA MSK: w/c for locomotion Neuro: +generalized weakness,  + cognitive impairment Psych: non-anxious affect, A and O to person Thank you for the opportunity to participate in the care of Matthew. Goodwin. Please call our office at [phone] if we can be of additional assistance.   Matthew Goodwin [name], NP

## 2022-04-19 ENCOUNTER — Non-Acute Institutional Stay: Payer: Medicare Other | Admitting: Nurse Practitioner

## 2022-04-19 ENCOUNTER — Encounter: Payer: Self-pay | Admitting: Nurse Practitioner

## 2022-04-19 DIAGNOSIS — R531 Weakness: Secondary | ICD-10-CM

## 2022-04-19 DIAGNOSIS — R630 Anorexia: Secondary | ICD-10-CM

## 2022-04-19 DIAGNOSIS — F03918 Unspecified dementia, unspecified severity, with other behavioral disturbance: Secondary | ICD-10-CM

## 2022-04-19 DIAGNOSIS — Z515 Encounter for palliative care: Secondary | ICD-10-CM

## 2022-04-19 NOTE — Progress Notes (Addendum)
Therapist, nutritional Palliative Care Consult Note Telephone: [phone]  Fax: [phone]    Date of encounter: 04/19/22 3:47 PM PATIENT NAME: [facility] [address] [place] [address]   [phone] (home)  DOB: December 19, 1920 MRN: [id number] PRIMARY CARE PROVIDER:   [facility] Smiley Houseman, NP,  [address] [place] [address] [phone]  RESPONSIBLE PARTY:    Contact Information     Name Relation Home Work Mobile   Gordon, Vandunk   [phone]   Burnett Harry YEars Other [phone]  [phone]     I met face to face with patient in facility. Palliative Care was asked to follow this patient by consultation request of  Sherrie Mustache, MD to address advance care planning and complex medical decision making. This is a follow up visit.  ASSESSMENT AND PLAN / RECOMMENDATIONS:  Symptom Management/Plan: 1. Advance Care Planning;  DNR 2. Goals of Care: Goals include to maximize quality of life and symptom management. Our advance care planning conversation included a discussion about:    The value and importance of advance care planning  Exploration of personal, cultural or spiritual beliefs that might influence medical decisions  Exploration of goals of care in the event of a sudden injury or illness  Identification and preparation of a healthcare agent  Review and updating or creation of an advance directive document. 3. Palliative care encounter; Palliative care encounter; Palliative medicine team will continue to support patient, patient's family, and medical team. Visit consisted of counseling and education dealing with the complex and emotionally intense issues of symptom management and palliative care in the setting of serious and potentially life-threatening illness   4. weight loss with generalized weakness secondary to dementia- W/C used for mobility. Discussed with Mr Gonzalez falls/safety. Caregivers  to continue assisting with adl's. Reviewed and discussed weights, nutritional counseling and education provided to Mr Joles, supplements as able. Fall precautions 09/09/2019 weight 190 lbs 03/01/2022 weight 131.2 lbs 04/03/2022 weight 137 lbs  Follow up Palliative Care Visit: PC f/u visit further discussion monitor trends of appetite, weights, monitor for functional, cognitive decline with chronic disease progression, assess any active symptoms, supportive role. Palliative care will continue to follow for complex medical decision making, advance care planning, and clarification of goals. Return in 6-8 weeks or prn. PPS: 40% Chief Complaint: Follow up palliative consult for complex medical decision making, address goals, manage ongoing symptoms   I spent 45 minutes providing this consultation. More than 50% of the time in this consultation was spent in counseling and care coordination.    HISTORY OF PRESENT ILLNESS:  Vandell Kun is a 89 y.o. year old male  with dementia with behavioral disturbance, BPH, T2DM, essential hypertension, GERD, chronic anemia, hx of L1 compression fracture. I visited and observed Mr Aguas currently residing at [facility]. Purpose of today PC f/u visit further discussion monitor trends of appetite, weights, monitor for functional, cognitive decline with chronic disease progression, assess any active symptoms, supportive role. Mr Detloff was sitting in the w/c propelling up the hall and in/out the front door. Mr Dhondt was somewhat irritable answering questions, caregiver present. Mr Santilli endorses he has no needs. We talked about ros, functional ability, weights, appetite. Mr Sternberg was cooperative with assessment, support provided. Mr Buchler appears stable currently, medical goals, goc, medications reviewed, no new changes recommended today. Therapeutic listening, emotional support provided. Questions answered. Updated staff.    History obtained from review  of  EMR, discussion with primary team, and interview with family, facility staff/caregiver and/or Mr. Sindelar.  I reviewed available labs, medications, imaging, studies and related documents from the EMR.  Records reviewed and summarized above.  Physical Exam: General: frail appearing, thin, engaging male ENMT: oral mucous membranes moist CV: S1S2, RRR Pulmonary: LCTA MSK: w/c for locomotion Neuro: +generalized weakness,  + cognitive impairment Psych: non-anxious affect, A and O to person  Thank you for the opportunity to participate in the care of Mr. Helzer. Please call our office at [phone] if we can be of additional assistance.   Haizley Cannella Prince Rome, NP

## 2022-06-05 ENCOUNTER — Encounter: Payer: Medicare Other | Admitting: Nurse Practitioner

## 2022-06-05 ENCOUNTER — Encounter: Payer: Self-pay | Admitting: Nurse Practitioner

## 2022-06-05 ENCOUNTER — Non-Acute Institutional Stay: Payer: Medicare Other | Admitting: Nurse Practitioner

## 2022-06-05 DIAGNOSIS — R630 Anorexia: Secondary | ICD-10-CM

## 2022-06-05 DIAGNOSIS — Z515 Encounter for palliative care: Secondary | ICD-10-CM

## 2022-06-05 DIAGNOSIS — F03918 Unspecified dementia, unspecified severity, with other behavioral disturbance: Secondary | ICD-10-CM

## 2022-06-05 DIAGNOSIS — R531 Weakness: Secondary | ICD-10-CM

## 2022-06-05 NOTE — Progress Notes (Signed)
Therapist, nutritional Palliative Care Consult Note Telephone: [phone]  Fax: [phone]    Date of encounter: 06/05/22 5:34 PM PATIENT NAME: [facility] [address] [place] [address]   [phone] (home)  DOB: 1920-09-19 MRN: [id number] PRIMARY CARE PROVIDER:   [facility] Smiley Houseman, NP,  [address] [place] [address] [phone]  RESPONSIBLE PARTY:    Contact Information     Name Relation Home Work Mobile   Sollie, [place]   [phone]   Burnett Harry YEars Other [phone]  [phone]        I met face to face with patient in facility. Palliative Care was asked to follow this patient by consultation request of  Sherrie Mustache, MD to address advance care planning and complex medical decision making. This is a follow up visit.  ASSESSMENT AND PLAN / RECOMMENDATIONS:  Symptom Management/Plan: 1. Advance Care Planning;  DNR 2. Goals of Care: Goals include to maximize quality of life and symptom management. Our advance care planning conversation included a discussion about:    The value and importance of advance care planning  Exploration of personal, cultural or spiritual beliefs that might influence medical decisions  Exploration of goals of care in the event of a sudden injury or illness  Identification and preparation of a healthcare agent  Review and updating or creation of an advance directive document. 3. Palliative care encounter; Palliative care encounter; Palliative medicine team will continue to support patient, patient's family, and medical team. Visit consisted of counseling and education dealing with the complex and emotionally intense issues of symptom management and palliative care in the setting of serious and potentially life-threatening illness   4. Anorexia with generalized weakness secondary to dementia- W/C used for mobility. Discussed with Mr Selders falls/safety.  Caregivers to continue assisting with adl's. Reviewed and discussed weights, nutritional counseling and education provided to Mr Quebedeaux, supplements as able. Fall precautions 09/09/2019 weight 190 lbs 03/01/2022 weight 131.2 lbs 04/03/2022 weight 137 lbs No current weight Follow up Palliative Care Visit: PC f/u visit further discussion monitor trends of appetite, weights, monitor for functional, cognitive decline with chronic disease progression, assess any active symptoms, supportive role. Palliative care will continue to follow for complex medical decision making, advance care planning, and clarification of goals. Return in 2-8 weeks or prn. PPS: 40% Chief Complaint: Follow up palliative consult for complex medical decision making, address goals, manage ongoing symptoms   I spent 42 minutes providing this consultation. More than 50% of the time in this consultation was spent in counseling and care coordination.    HISTORY OF PRESENT ILLNESS:  Leotha Urtado is a 89 y.o. year old male  with dementia with behavioral disturbance, BPH, T2DM, essential hypertension, GERD, chronic anemia, hx of L1 compression fracture. I visited and observed Mr Lopezramirez currently residing at [facility]. Purpose of today PC f/u visit further discussion monitor trends of appetite, weights, monitor for functional, cognitive decline with chronic disease progression, assess any active symptoms, supportive role. Mr Upshur was sitting in the w/c propelling in the day room.  Mr Colan was cooperative with assessment, support provided. Mr Angeli appears stable currently, medical goals, goc, medications reviewed, no new changes recommended today. Therapeutic listening, emotional support provided. Questions answered. Updated staff.    History obtained from review of EMR, discussion with primary team, and interview with family, facility staff/caregiver and/or Mr. Teas.  I reviewed available labs, medications, imaging,  studies and  related documents from the EMR.  Records reviewed and summarized above.  Physical Exam: General: frail appearing, thin, engaging male ENMT: oral mucous membranes moist CV: S1S2, RRR Pulmonary: LCTA MSK: w/c for locomotion, propels with his feet Neuro: +generalized weakness,  + cognitive impairment Psych: non-anxious affect, A and O to person  Thank you for the opportunity to participate in the care of Mr. Padfield. Please call our office at [phone] if we can be of additional assistance.   Fedor Kazmierski Prince Rome, NP

## 2022-06-11 ENCOUNTER — Encounter: Payer: Self-pay | Admitting: Medical Oncology

## 2022-06-11 ENCOUNTER — Emergency Department: Payer: Medicare Other

## 2022-06-11 ENCOUNTER — Inpatient Hospital Stay
Admission: EM | Admit: 2022-06-11 | Discharge: 2022-06-17 | DRG: 388 | Disposition: A | Discharge status: Skilled Nursing Facility | Payer: Medicare Other | Source: Skilled Nursing Facility | Attending: Internal Medicine | Admitting: Internal Medicine

## 2022-06-11 DIAGNOSIS — I10 Essential (primary) hypertension: Secondary | ICD-10-CM | POA: Diagnosis present

## 2022-06-11 DIAGNOSIS — K56609 Unspecified intestinal obstruction, unspecified as to partial versus complete obstruction: Secondary | ICD-10-CM | POA: Diagnosis not present

## 2022-06-11 DIAGNOSIS — E870 Hyperosmolality and hypernatremia: Secondary | ICD-10-CM | POA: Diagnosis not present

## 2022-06-11 DIAGNOSIS — E119 Type 2 diabetes mellitus without complications: Secondary | ICD-10-CM | POA: Diagnosis present

## 2022-06-11 DIAGNOSIS — R112 Nausea with vomiting, unspecified: Secondary | ICD-10-CM | POA: Diagnosis not present

## 2022-06-11 DIAGNOSIS — Z66 Do not resuscitate: Secondary | ICD-10-CM | POA: Diagnosis present

## 2022-06-11 DIAGNOSIS — E878 Other disorders of electrolyte and fluid balance, not elsewhere classified: Secondary | ICD-10-CM | POA: Diagnosis present

## 2022-06-11 DIAGNOSIS — N210 Calculus in bladder: Secondary | ICD-10-CM | POA: Diagnosis present

## 2022-06-11 DIAGNOSIS — N179 Acute kidney failure, unspecified: Secondary | ICD-10-CM | POA: Diagnosis present

## 2022-06-11 DIAGNOSIS — I119 Hypertensive heart disease without heart failure: Secondary | ICD-10-CM | POA: Diagnosis present

## 2022-06-11 DIAGNOSIS — E43 Unspecified severe protein-calorie malnutrition: Secondary | ICD-10-CM | POA: Diagnosis present

## 2022-06-11 DIAGNOSIS — G9341 Metabolic encephalopathy: Secondary | ICD-10-CM | POA: Diagnosis not present

## 2022-06-11 DIAGNOSIS — J90 Pleural effusion, not elsewhere classified: Secondary | ICD-10-CM | POA: Diagnosis present

## 2022-06-11 DIAGNOSIS — D638 Anemia in other chronic diseases classified elsewhere: Secondary | ICD-10-CM | POA: Diagnosis present

## 2022-06-11 DIAGNOSIS — R001 Bradycardia, unspecified: Secondary | ICD-10-CM | POA: Diagnosis present

## 2022-06-11 DIAGNOSIS — K219 Gastro-esophageal reflux disease without esophagitis: Secondary | ICD-10-CM | POA: Diagnosis present

## 2022-06-11 DIAGNOSIS — N400 Enlarged prostate without lower urinary tract symptoms: Secondary | ICD-10-CM | POA: Diagnosis present

## 2022-06-11 DIAGNOSIS — Z681 Body mass index (BMI) 19 or less, adult: Secondary | ICD-10-CM

## 2022-06-11 DIAGNOSIS — R1310 Dysphagia, unspecified: Secondary | ICD-10-CM | POA: Diagnosis present

## 2022-06-11 DIAGNOSIS — Z79899 Other long term (current) drug therapy: Secondary | ICD-10-CM

## 2022-06-11 DIAGNOSIS — Z950 Presence of cardiac pacemaker: Secondary | ICD-10-CM

## 2022-06-11 DIAGNOSIS — E785 Hyperlipidemia, unspecified: Secondary | ICD-10-CM | POA: Diagnosis present

## 2022-06-11 DIAGNOSIS — E876 Hypokalemia: Secondary | ICD-10-CM | POA: Diagnosis present

## 2022-06-11 DIAGNOSIS — D649 Anemia, unspecified: Secondary | ICD-10-CM | POA: Diagnosis present

## 2022-06-11 DIAGNOSIS — F03918 Unspecified dementia, unspecified severity, with other behavioral disturbance: Secondary | ICD-10-CM | POA: Diagnosis present

## 2022-06-11 DIAGNOSIS — N390 Urinary tract infection, site not specified: Secondary | ICD-10-CM | POA: Diagnosis present

## 2022-06-11 LAB — CBC
HCT: 39 % (ref 39.0–52.0)
Hemoglobin: 12.5 g/dL — ABNORMAL LOW (ref 13.0–17.0)
MCH: 29.8 pg (ref 26.0–34.0)
MCHC: 32.1 g/dL (ref 30.0–36.0)
MCV: 93.1 fL (ref 80.0–100.0)
Platelets: 168 10*3/uL (ref 150–400)
RBC: 4.19 MIL/uL — ABNORMAL LOW (ref 4.22–5.81)
RDW: 13.2 % (ref 11.5–15.5)
WBC: 4.7 10*3/uL (ref 4.0–10.5)
nRBC: 0 % (ref 0.0–0.2)

## 2022-06-11 LAB — COMPREHENSIVE METABOLIC PANEL
ALT: 12 U/L (ref 0–44)
AST: 21 U/L (ref 15–41)
Albumin: 3.8 g/dL (ref 3.5–5.0)
Alkaline Phosphatase: 53 U/L (ref 38–126)
Anion gap: 10 (ref 5–15)
BUN: 21 mg/dL (ref 8–23)
CO2: 25 mmol/L (ref 22–32)
Calcium: 9.2 mg/dL (ref 8.9–10.3)
Chloride: 104 mmol/L (ref 98–111)
Creatinine, Ser: 1.27 mg/dL — ABNORMAL HIGH (ref 0.61–1.24)
GFR, Estimated: 50 mL/min — ABNORMAL LOW (ref >60)
Glucose, Bld: 131 mg/dL — ABNORMAL HIGH (ref 70–99)
Potassium: 4.4 mmol/L (ref 3.5–5.1)
Sodium: 139 mmol/L (ref 135–145)
Total Bilirubin: 0.8 mg/dL (ref 0.3–1.2)
Total Protein: 7 g/dL (ref 6.5–8.1)

## 2022-06-11 LAB — URINALYSIS, ROUTINE W REFLEX MICROSCOPIC
Bilirubin Urine: NEGATIVE
Glucose, UA: NEGATIVE mg/dL
Hgb urine dipstick: NEGATIVE
Ketones, ur: NEGATIVE mg/dL
Nitrite: POSITIVE — AB
Protein, ur: NEGATIVE mg/dL
Specific Gravity, Urine: 1.024 (ref 1.005–1.030)
pH: 5 (ref 5.0–8.0)

## 2022-06-11 LAB — LIPASE, BLOOD: Lipase: 29 U/L (ref 11–51)

## 2022-06-11 MED ORDER — SODIUM CHLORIDE 0.9 % IV BOLUS: 500.0000 mL | Freq: Once | INTRAVENOUS | Status: DC

## 2022-06-11 MED ORDER — IOHEXOL 300 MG/ML  SOLN
100.0000 mL | Freq: Once | INTRAMUSCULAR | Status: AC | PRN
  Administered 2022-06-11: 100 mL via INTRAVENOUS

## 2022-06-11 MED ORDER — SODIUM CHLORIDE 0.9 % IV SOLN
1.0000 g | Freq: Once | INTRAVENOUS | Status: AC
  Administered 2022-06-11: 1 g via INTRAVENOUS
  Filled 2022-06-11: qty 10

## 2022-06-11 NOTE — Hospital Course (Addendum)
Matthew Goodwin is a 89 y.o. male with medical history significant for diabetes mellitus type 2, hypertension, BPH, anemia, UTI presenting with complaints of nausea vomiting and generalized abdominal pain that started about a day prior.  CT scan showed small bowel obstruction.  Patient was placed n.p.o., IV fluids.  Patient was also consulted for surgery.  Due to poor prognosis, palliative care consult obtained. Patient condition finally improved, had a bowel movement.  Diet started per general surgery. 6/14.  SBO resolved, but patient developed confusion, dysphagia.

## 2022-06-11 NOTE — ED Provider Notes (Signed)
[facility] Provider Note    Event Date/Time   First MD Initiated Contact with Patient 06/11/22 1942     (approximate)   History   Nausea and Emesis   HPI  Julien Skarda is a 89 y.o. male who presents to the emergency department today via EMS because of concerns for nausea and vomiting and abdominal pain.  Symptoms started last night.  Per patient's roommate had multiple episodes of vomiting.  Patient himself cannot give great history.  Does deny any pain at the time my exam.  Vital signs and blood sugar for EMS without concerning abnormalities.     Physical Exam   Triage Vital Signs: ED Triage Vitals  Enc Vitals Group     BP 06/11/22 1949 131/83     Pulse Rate 06/11/22 1949 60     Resp 06/11/22 1949 20     Temp 06/11/22 1949 98.4 F (36.9 C)     Temp Source 06/11/22 1949 Oral     SpO2 06/11/22 1940 98 %     Weight 06/11/22 1943 182 lb 15.7 oz (83 kg)     Height 06/11/22 1943 6\' 2"  (1.88 m)     Head Circumference --      Peak Flow --      Pain Score 06/11/22 1943 0     Pain Loc --      Pain Edu? --      Excl. in GC? --     Most recent vital signs: Vitals:   06/11/22 1949 06/11/22 2000  BP: 131/83 126/80  Pulse: 60 61  Resp: 20 20  Temp: 98.4 F (36.9 C)   SpO2: 100% 100%   General: Awake, alert, not completely oriented. CV:  Good peripheral perfusion. Regular rate and rhythm. Resp:  Normal effort. Lungs clear. Abd:  No distention.    ED Results / Procedures / Treatments   Labs (all labs ordered are listed, but only abnormal results are displayed) Labs Reviewed  COMPREHENSIVE METABOLIC PANEL - Abnormal; Notable for the following components:      Result Value   Glucose, Bld 131 (*)    Creatinine, Ser 1.27 (*)    GFR, Estimated 50 (*)    All other components within normal limits  CBC - Abnormal; Notable for the following components:   RBC 4.19 (*)    Hemoglobin 12.5 (*)    All other components within normal limits   URINALYSIS, ROUTINE W REFLEX MICROSCOPIC - Abnormal; Notable for the following components:   Color, Urine YELLOW (*)    APPearance CLOUDY (*)    Nitrite POSITIVE (*)    Leukocytes,Ua SMALL (*)    Bacteria, UA RARE (*)    All other components within normal limits  LIPASE, BLOOD     EKG  I, Phineas Semen, attending physician, personally viewed and interpreted this EKG  EKG Time: 1948 Rate: 61 Rhythm: sinus arrhythmia Axis: normal Intervals: qtc 438 QRS: narrow ST changes: no st elevation Impression: abnormal ekg   RADIOLOGY I independently interpreted and visualized the CT abd/pel. My interpretation: SBO Radiology interpretation:  IMPRESSION:  1. Small-bowel obstruction with transition point in the right lower  quadrant. There is some swirling of the mesentery in the reason of  transition raising concern for internal hernia.  2. Probable 7 mm stone within decompressed urinary bladder. No  hydronephrosis.    Aortic Atherosclerosis (ICD10-I70.0).    PROCEDURES:  Critical Care performed: No    MEDICATIONS ORDERED IN ED:  Medications - No data to display   IMPRESSION / MDM / ASSESSMENT AND PLAN / ED COURSE  I reviewed the triage vital signs and the nursing notes.                              Differential diagnosis includes, but is not limited to, diverticulitis, gastroenteritis, SBO  Patient's presentation is most consistent with acute presentation with potential threat to life or bodily function.   The patient is on the cardiac monitor to evaluate for evidence of arrhythmia and/or significant heart rate changes.  Patient presented to the emergency department today because of concern for abdominal pain, nausea and vomiting. CT scan was obtained which was concerning for SBO. Discussed with Dr. Maia Plan with surgery who recommended NG tube. Discussed with Dr. Allena Katz with the hospitalist service who will plan on admission.     FINAL CLINICAL IMPRESSION(S) / ED  DIAGNOSES   Final diagnoses:  Small bowel obstruction (HCC)  Lower urinary tract infectious disease     Note:  This document was prepared using Dragon voice recognition software and may include unintentional dictation errors.    Phineas Semen, MD 06/11/22 [phone]

## 2022-06-11 NOTE — ED Triage Notes (Signed)
Pt from [facility] with reports that pt began last night having N/V and generalized abd pain. Pt denies pain upon arrival to ED.

## 2022-06-11 NOTE — ED Notes (Signed)
16 fr Ng tube placed @ 63cm with no complications. Patient tolerated well. Awaiting confirmation from CT for placement.

## 2022-06-12 ENCOUNTER — Encounter: Payer: Self-pay | Admitting: Internal Medicine

## 2022-06-12 ENCOUNTER — Observation Stay: Payer: Medicare Other

## 2022-06-12 DIAGNOSIS — I119 Hypertensive heart disease without heart failure: Secondary | ICD-10-CM | POA: Diagnosis present

## 2022-06-12 DIAGNOSIS — N390 Urinary tract infection, site not specified: Secondary | ICD-10-CM | POA: Diagnosis present

## 2022-06-12 DIAGNOSIS — E785 Hyperlipidemia, unspecified: Secondary | ICD-10-CM | POA: Diagnosis present

## 2022-06-12 DIAGNOSIS — G9341 Metabolic encephalopathy: Secondary | ICD-10-CM | POA: Diagnosis not present

## 2022-06-12 DIAGNOSIS — E876 Hypokalemia: Secondary | ICD-10-CM | POA: Diagnosis present

## 2022-06-12 DIAGNOSIS — E119 Type 2 diabetes mellitus without complications: Secondary | ICD-10-CM | POA: Diagnosis present

## 2022-06-12 DIAGNOSIS — R1114 Bilious vomiting: Secondary | ICD-10-CM | POA: Diagnosis not present

## 2022-06-12 DIAGNOSIS — R531 Weakness: Secondary | ICD-10-CM | POA: Diagnosis not present

## 2022-06-12 DIAGNOSIS — Z515 Encounter for palliative care: Secondary | ICD-10-CM | POA: Diagnosis not present

## 2022-06-12 DIAGNOSIS — F03918 Unspecified dementia, unspecified severity, with other behavioral disturbance: Secondary | ICD-10-CM

## 2022-06-12 DIAGNOSIS — R001 Bradycardia, unspecified: Secondary | ICD-10-CM | POA: Diagnosis present

## 2022-06-12 DIAGNOSIS — E43 Unspecified severe protein-calorie malnutrition: Secondary | ICD-10-CM | POA: Diagnosis present

## 2022-06-12 DIAGNOSIS — K56609 Unspecified intestinal obstruction, unspecified as to partial versus complete obstruction: Secondary | ICD-10-CM | POA: Diagnosis present

## 2022-06-12 DIAGNOSIS — J90 Pleural effusion, not elsewhere classified: Secondary | ICD-10-CM | POA: Diagnosis present

## 2022-06-12 DIAGNOSIS — N400 Enlarged prostate without lower urinary tract symptoms: Secondary | ICD-10-CM | POA: Diagnosis present

## 2022-06-12 DIAGNOSIS — I10 Essential (primary) hypertension: Secondary | ICD-10-CM | POA: Diagnosis not present

## 2022-06-12 DIAGNOSIS — N179 Acute kidney failure, unspecified: Secondary | ICD-10-CM | POA: Diagnosis present

## 2022-06-12 DIAGNOSIS — Z66 Do not resuscitate: Secondary | ICD-10-CM | POA: Diagnosis present

## 2022-06-12 DIAGNOSIS — N210 Calculus in bladder: Secondary | ICD-10-CM | POA: Diagnosis present

## 2022-06-12 DIAGNOSIS — E870 Hyperosmolality and hypernatremia: Secondary | ICD-10-CM | POA: Diagnosis not present

## 2022-06-12 DIAGNOSIS — R627 Adult failure to thrive: Secondary | ICD-10-CM | POA: Diagnosis not present

## 2022-06-12 DIAGNOSIS — R1310 Dysphagia, unspecified: Secondary | ICD-10-CM | POA: Diagnosis present

## 2022-06-12 DIAGNOSIS — Z950 Presence of cardiac pacemaker: Secondary | ICD-10-CM | POA: Diagnosis not present

## 2022-06-12 DIAGNOSIS — K219 Gastro-esophageal reflux disease without esophagitis: Secondary | ICD-10-CM | POA: Diagnosis present

## 2022-06-12 DIAGNOSIS — N3000 Acute cystitis without hematuria: Secondary | ICD-10-CM | POA: Diagnosis not present

## 2022-06-12 DIAGNOSIS — Z681 Body mass index (BMI) 19 or less, adult: Secondary | ICD-10-CM | POA: Diagnosis not present

## 2022-06-12 DIAGNOSIS — R112 Nausea with vomiting, unspecified: Secondary | ICD-10-CM | POA: Diagnosis present

## 2022-06-12 DIAGNOSIS — Z79899 Other long term (current) drug therapy: Secondary | ICD-10-CM | POA: Diagnosis not present

## 2022-06-12 DIAGNOSIS — D638 Anemia in other chronic diseases classified elsewhere: Secondary | ICD-10-CM | POA: Diagnosis present

## 2022-06-12 DIAGNOSIS — E878 Other disorders of electrolyte and fluid balance, not elsewhere classified: Secondary | ICD-10-CM | POA: Diagnosis present

## 2022-06-12 LAB — COMPREHENSIVE METABOLIC PANEL
ALT: 11 U/L (ref 0–44)
AST: 22 U/L (ref 15–41)
Albumin: 3.6 g/dL (ref 3.5–5.0)
Alkaline Phosphatase: 48 U/L (ref 38–126)
Anion gap: 10 (ref 5–15)
BUN: 20 mg/dL (ref 8–23)
CO2: 21 mmol/L — ABNORMAL LOW (ref 22–32)
Calcium: 8.6 mg/dL — ABNORMAL LOW (ref 8.9–10.3)
Chloride: 107 mmol/L (ref 98–111)
Creatinine, Ser: 1.16 mg/dL (ref 0.61–1.24)
GFR, Estimated: 56 mL/min — ABNORMAL LOW (ref >60)
Glucose, Bld: 166 mg/dL — ABNORMAL HIGH (ref 70–99)
Potassium: 4.2 mmol/L (ref 3.5–5.1)
Sodium: 138 mmol/L (ref 135–145)
Total Bilirubin: 0.7 mg/dL (ref 0.3–1.2)
Total Protein: 6.3 g/dL — ABNORMAL LOW (ref 6.5–8.1)

## 2022-06-12 LAB — CBC
HCT: 35.1 % — ABNORMAL LOW (ref 39.0–52.0)
Hemoglobin: 11.3 g/dL — ABNORMAL LOW (ref 13.0–17.0)
MCH: 29.4 pg (ref 26.0–34.0)
MCHC: 32.2 g/dL (ref 30.0–36.0)
MCV: 91.2 fL (ref 80.0–100.0)
Platelets: 157 10*3/uL (ref 150–400)
RBC: 3.85 MIL/uL — ABNORMAL LOW (ref 4.22–5.81)
RDW: 13.2 % (ref 11.5–15.5)
WBC: 4.2 10*3/uL (ref 4.0–10.5)
nRBC: 0 % (ref 0.0–0.2)

## 2022-06-12 LAB — CBG MONITORING, ED
Glucose-Capillary: 126 mg/dL — ABNORMAL HIGH (ref 70–99)
Glucose-Capillary: 114 mg/dL — ABNORMAL HIGH (ref 70–99)
Glucose-Capillary: 104 mg/dL — ABNORMAL HIGH (ref 70–99)

## 2022-06-12 LAB — GLUCOSE, CAPILLARY
Glucose-Capillary: 77 mg/dL (ref 70–99)
Glucose-Capillary: 103 mg/dL — ABNORMAL HIGH (ref 70–99)

## 2022-06-12 LAB — MAGNESIUM: Magnesium: 2.3 mg/dL (ref 1.7–2.4)

## 2022-06-12 MED ORDER — INSULIN ASPART 100 UNIT/ML IJ SOLN
0.0000 [IU] | Freq: Three times a day (TID) | INTRAMUSCULAR | Status: DC
  Administered 2022-06-17: 1 [IU] via SUBCUTANEOUS
  Filled 2022-06-12: qty 1

## 2022-06-12 MED ORDER — DEXTROSE-SODIUM CHLORIDE 5-0.45 % IV SOLN: INTRAVENOUS | Status: DC

## 2022-06-12 MED ORDER — MORPHINE SULFATE (PF) 2 MG/ML IV SOLN
2.0000 mg | INTRAVENOUS | Status: DC | PRN
  Administered 2022-06-13: 2 mg via INTRAVENOUS
  Filled 2022-06-12: qty 1

## 2022-06-12 MED ORDER — SODIUM CHLORIDE 0.9% FLUSH
3.0000 mL | Freq: Two times a day (BID) | INTRAVENOUS | Status: DC
  Administered 2022-06-12 – 2022-06-17 (×11): 3 mL via INTRAVENOUS

## 2022-06-12 MED ORDER — SODIUM CHLORIDE 0.9 % IV SOLN
1.0000 g | INTRAVENOUS | Status: DC
  Administered 2022-06-12 – 2022-06-13 (×2): 1 g via INTRAVENOUS
  Filled 2022-06-12 (×2): qty 10

## 2022-06-12 MED ORDER — ACETAMINOPHEN 325 MG PO TABS: 650.0000 mg | ORAL_TABLET | Freq: Four times a day (QID) | ORAL | Status: DC | PRN

## 2022-06-12 MED ORDER — HYDRALAZINE HCL 20 MG/ML IJ SOLN
5.0000 mg | INTRAMUSCULAR | Status: DC | PRN
  Administered 2022-06-13 – 2022-06-16 (×2): 5 mg via INTRAVENOUS
  Filled 2022-06-12 (×2): qty 1

## 2022-06-12 MED ORDER — LORAZEPAM 2 MG/ML IJ SOLN
1.0000 mg | Freq: Once | INTRAMUSCULAR | Status: AC
  Administered 2022-06-12: 1 mg via INTRAVENOUS
  Filled 2022-06-12: qty 1

## 2022-06-12 MED ORDER — LORAZEPAM 2 MG/ML IJ SOLN
0.5000 mg | Freq: Four times a day (QID) | INTRAMUSCULAR | Status: DC | PRN
  Administered 2022-06-13 (×2): 0.5 mg via INTRAVENOUS
  Filled 2022-06-12 (×2): qty 1

## 2022-06-12 MED ORDER — ACETAMINOPHEN 650 MG RE SUPP: 650.0000 mg | Freq: Four times a day (QID) | RECTAL | Status: DC | PRN

## 2022-06-12 MED ORDER — SODIUM CHLORIDE 0.9 % IV SOLN: 1.0000 g | INTRAVENOUS | Status: DC

## 2022-06-12 MED ORDER — HEPARIN SODIUM (PORCINE) 5000 UNIT/ML IJ SOLN
5000.0000 [IU] | Freq: Three times a day (TID) | INTRAMUSCULAR | Status: DC
  Administered 2022-06-12 – 2022-06-17 (×15): 5000 [IU] via SUBCUTANEOUS
  Filled 2022-06-12 (×16): qty 1

## 2022-06-12 MED ORDER — FLUOROMETHOLONE 0.1 % OP SUSP: 1.0000 [drp] | Freq: Every day | OPHTHALMIC | Status: DC | PRN

## 2022-06-12 NOTE — Assessment & Plan Note (Signed)
    Latest Ref Rng & Units 06/11/2022    7:53 PM 12/14/2021    5:36 AM 12/13/2021    8:14 PM  CBC  WBC 4.0 - 10.5 K/uL 4.7  6.4  7.7   Hemoglobin 13.0 - 17.0 g/dL 86.5  78.4  69.6   Hematocrit 39.0 - 52.0 % 39.0  38.9  43.2   Platelets 150 - 400 K/uL 168  174  223   Mild, chronic. Will follow.

## 2022-06-12 NOTE — Assessment & Plan Note (Signed)
IV PPI therapy.  

## 2022-06-12 NOTE — Assessment & Plan Note (Signed)
Will follow urinary output. Resume Flomax once SBO is resolved.

## 2022-06-12 NOTE — Final Progress Note (Signed)
Same day rounding progress note  Patient seen and examined in the ED.  Agree with assessment and plan dictated in Dr. Eliane Decree dictated history and physical.  Please see dictated H&P for further details.  SBO Patient pulled out NG tube several times while in the ED.  Conservative management planned after discussion with surgery and family.  Son is wanting NG tube and and requesting to have mittens or soft restraints to keep him from pulling out NG -Son is not ready for hospice yet.  Patient is followed by palliative care as an outpatient  Time spent 20 minutes

## 2022-06-12 NOTE — ED Notes (Signed)
Patient pulled out NG tube. Spoke with patient and explained that the tube has to stay in place as per MD. Patient agreed to reinsert.
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
## 2022-06-12 NOTE — Assessment & Plan Note (Signed)
Patient is oriented to location but not to his date of birth and does not remember. Secondary to advanced age and suspect underlying vascular dementia.

## 2022-06-12 NOTE — Assessment & Plan Note (Signed)
EKG today shows sinus arrhythmia at 61, ST depressions in lead to 3 and lateral leads which are similar to previous EKG, SPECT patient may have bradycardia as evidenced on telemetry we will continue to monitor.

## 2022-06-12 NOTE — ED Notes (Signed)
Pt removed NG tube with mittens on. MD made aware.

## 2022-06-12 NOTE — Assessment & Plan Note (Signed)
Will monitor for electrolyte abnormalities potassium, magnesium, calcium replace and follow.

## 2022-06-12 NOTE — Assessment & Plan Note (Signed)
2/2 SBO. NGT placed.  Xray confirmation pending.  NPO. IV PPI. General surgery consulted.

## 2022-06-12 NOTE — ED Notes (Signed)
Patient pulled out NG tube, stated that he don't want anything in his nose. Refusing for reinsert. MD made aware .
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
## 2022-06-12 NOTE — ED Notes (Signed)
Pt removed mitts and IV.

## 2022-06-12 NOTE — Assessment & Plan Note (Signed)
Urinalysis    Component Value Date/Time   COLORURINE YELLOW (A) 06/11/2022 2000   APPEARANCEUR CLOUDY (A) 06/11/2022 2000   LABSPEC 1.024 06/11/2022 2000   PHURINE 5.0 06/11/2022 2000   GLUCOSEU NEGATIVE 06/11/2022 2000   HGBUR NEGATIVE 06/11/2022 2000   BILIRUBINUR NEGATIVE 06/11/2022 2000   KETONESUR NEGATIVE 06/11/2022 2000   PROTEINUR NEGATIVE 06/11/2022 2000   NITRITE POSITIVE (A) 06/11/2022 2000   LEUKOCYTESUR SMALL (A) 06/11/2022 2000  We will follow culture sensitivity and further tailor antibiotic regimen.

## 2022-06-12 NOTE — Progress Notes (Signed)
Patient arrived to unit in bed.  Patient alert, disoriented x 4 with bilateral hand mitts in place, NG tube clamped, purwick to suction.

## 2022-06-12 NOTE — Assessment & Plan Note (Signed)
Currently patient n.p.o. with Accu-Cheks every 4 hours and glycemic protocol.

## 2022-06-12 NOTE — Assessment & Plan Note (Addendum)
Lab Results  Component Value Date   CREATININE 1.27 (H) 06/11/2022   CREATININE 0.94 12/14/2021   CREATININE 1.05 12/13/2021  Secondary to patient's nausea and vomiting and hypovolemia and decreased p.o. intake. Will avoid nephrotoxic agents and renally dose all medications. No contrast studies. Hold Cozaar

## 2022-06-12 NOTE — Assessment & Plan Note (Addendum)
Vitals:   06/11/22 1949 06/11/22 2000 06/11/22 2030 06/12/22 0000  BP: 131/83 126/80 133/78 (!) 146/84  P.o. meds currently held metoprolol and Cozaar secondary to AKI. As needed IV hydralazine with lower parameter for treatment for blood pressure.

## 2022-06-13 DIAGNOSIS — R627 Adult failure to thrive: Secondary | ICD-10-CM | POA: Diagnosis not present

## 2022-06-13 DIAGNOSIS — N3000 Acute cystitis without hematuria: Secondary | ICD-10-CM

## 2022-06-13 DIAGNOSIS — Z515 Encounter for palliative care: Secondary | ICD-10-CM | POA: Diagnosis not present

## 2022-06-13 DIAGNOSIS — E43 Unspecified severe protein-calorie malnutrition: Secondary | ICD-10-CM | POA: Insufficient documentation

## 2022-06-13 DIAGNOSIS — K56609 Unspecified intestinal obstruction, unspecified as to partial versus complete obstruction: Secondary | ICD-10-CM | POA: Diagnosis not present

## 2022-06-13 DIAGNOSIS — F03918 Unspecified dementia, unspecified severity, with other behavioral disturbance: Secondary | ICD-10-CM | POA: Diagnosis not present

## 2022-06-13 LAB — GLUCOSE, CAPILLARY
Glucose-Capillary: 82 mg/dL (ref 70–99)
Glucose-Capillary: 82 mg/dL (ref 70–99)
Glucose-Capillary: 90 mg/dL (ref 70–99)
Glucose-Capillary: 96 mg/dL (ref 70–99)
Glucose-Capillary: 102 mg/dL — ABNORMAL HIGH (ref 70–99)
Glucose-Capillary: 124 mg/dL — ABNORMAL HIGH (ref 70–99)

## 2022-06-13 LAB — CBC
HCT: 36 % — ABNORMAL LOW (ref 39.0–52.0)
Hemoglobin: 11.6 g/dL — ABNORMAL LOW (ref 13.0–17.0)
MCH: 29.5 pg (ref 26.0–34.0)
MCHC: 32.2 g/dL (ref 30.0–36.0)
MCV: 91.6 fL (ref 80.0–100.0)
Platelets: 164 10*3/uL (ref 150–400)
RBC: 3.93 MIL/uL — ABNORMAL LOW (ref 4.22–5.81)
RDW: 12.5 % (ref 11.5–15.5)
WBC: 4.7 10*3/uL (ref 4.0–10.5)
nRBC: 0 % (ref 0.0–0.2)

## 2022-06-13 LAB — BASIC METABOLIC PANEL
Anion gap: 10 (ref 5–15)
BUN: 16 mg/dL (ref 8–23)
CO2: 23 mmol/L (ref 22–32)
Calcium: 8.4 mg/dL — ABNORMAL LOW (ref 8.9–10.3)
Chloride: 107 mmol/L (ref 98–111)
Creatinine, Ser: 0.99 mg/dL (ref 0.61–1.24)
GFR, Estimated: >60 mL/min (ref >60)
Glucose, Bld: 103 mg/dL — ABNORMAL HIGH (ref 70–99)
Potassium: 3.5 mmol/L (ref 3.5–5.1)
Sodium: 140 mmol/L (ref 135–145)

## 2022-06-13 MED ORDER — POTASSIUM CHLORIDE 10 MEQ/100ML IV SOLN: 10.0000 meq | Freq: Once | INTRAVENOUS | Status: DC

## 2022-06-13 MED ORDER — HALOPERIDOL LACTATE 5 MG/ML IJ SOLN
2.0000 mg | Freq: Four times a day (QID) | INTRAMUSCULAR | Status: DC | PRN
  Administered 2022-06-13: 2 mg via INTRAVENOUS
  Filled 2022-06-13: qty 1

## 2022-06-13 MED ORDER — POTASSIUM CHLORIDE 2 MEQ/ML IV SOLN: INTRAVENOUS | Status: DC

## 2022-06-13 MED ORDER — ORAL CARE MOUTH RINSE: 15.0000 mL | OROMUCOSAL | Status: DC | PRN

## 2022-06-13 MED ORDER — KCL-LACTATED RINGERS-D5W 20 MEQ/L IV SOLN
INTRAVENOUS | Status: DC
  Filled 2022-06-13 (×4): qty 1000

## 2022-06-13 NOTE — Plan of Care (Signed)
  Problem: Coping: Goal: Ability to adjust to condition or change in health will improve Outcome: Progressing   Problem: Fluid Volume: Goal: Ability to maintain a balanced intake and output will improve Outcome: Progressing   Problem: Metabolic: Goal: Ability to maintain appropriate glucose levels will improve Outcome: Progressing   Problem: Nutritional: Goal: Progress toward achieving an optimal weight will improve Outcome: Progressing   Problem: Skin Integrity: Goal: Risk for impaired skin integrity will decrease Outcome: Progressing   Problem: Tissue Perfusion: Goal: Adequacy of tissue perfusion will improve Outcome: Progressing   Problem: Clinical Measurements: Goal: Ability to maintain clinical measurements within normal limits will improve Outcome: Progressing Goal: Will remain free from infection Outcome: Progressing Goal: Diagnostic test results will improve Outcome: Progressing Goal: Respiratory complications will improve Outcome: Progressing Goal: Cardiovascular complication will be avoided Outcome: Progressing   Problem: Activity: Goal: Risk for activity intolerance will decrease Outcome: Progressing   Problem: Coping: Goal: Level of anxiety will decrease Outcome: Progressing   Problem: Elimination: Goal: Will not experience complications related to bowel motility Outcome: Progressing Goal: Will not experience complications related to urinary retention Outcome: Progressing   Problem: Pain Managment: Goal: General experience of comfort will improve Outcome: Progressing   Problem: Safety: Goal: Ability to remain free from injury will improve Outcome: Progressing   Problem: Skin Integrity: Goal: Risk for impaired skin integrity will decrease Outcome: Progressing   Problem: Education: Goal: Ability to describe self-care measures that may prevent or decrease complications (Diabetes Survival Skills Education) will improve Outcome: Not  Progressing Note: Patient experiencing confusion at this time. Will continue to educate and reorient as needed. Goal: Individualized Educational Video(s) Outcome: Not Progressing Note: Patient experiencing confusion at this time. Will continue to educate and reorient as needed.   Problem: Health Behavior/Discharge Planning: Goal: Ability to identify and utilize available resources and services will improve Outcome: Not Progressing Note: Patient experiencing confusion at this time. Will continue to educate and reorient as needed. Goal: Ability to manage health-related needs will improve Outcome: Not Progressing Note: Patient experiencing confusion at this time. Will continue to educate and reorient as needed.   Problem: Nutritional: Goal: Maintenance of adequate nutrition will improve Outcome: Not Progressing Note: Patient currently NPO with NG Tube placed at this time.   Problem: Education: Goal: Knowledge of General Education information will improve Description: Including pain rating scale, medication(s)/side effects and non-pharmacologic comfort measures Outcome: Not Progressing Note: Patient experiencing confusion at this time. Will continue to educate and reorient as needed.   Problem: Health Behavior/Discharge Planning: Goal: Ability to manage health-related needs will improve Outcome: Not Progressing Note: Patient experiencing confusion at this time. Will continue to educate and reorient as needed.   Problem: Nutrition: Goal: Adequate nutrition will be maintained Outcome: Not Progressing Note: Patient currently NPO with NG tube placed at this time.

## 2022-06-13 NOTE — Progress Notes (Signed)
Patient ID: Matthew Goodwin, male   DOB: March 30, 1920, 89 y.o.   MRN: [id number]     SURGICAL PROGRESS NOTE   Hospital Day(s): 1.   Interval History: Patient seen and examined, no acute events or new complaints overnight. Patient reports patient seen this morning.  He was found with no distress.  He had NGT in place.  Denies any complaint of abdominal pain.  Denies any nausea or vomiting.  Nurse denies any issues.  There is important to note that patient is not a good historian.  No sign of breath or gas or bowel movement  Vital signs in last 24 hours: [min-max] current  Temp:  [97.7 F (36.5 C)-98.5 F (36.9 C)] 98.4 F (36.9 C) (06/11 0807) Pulse Rate:  [58-75] 75 (06/11 1200) Resp:  [16-22] 18 (06/11 0807) BP: (117-196)/(68-99) 117/75 (06/11 1200) SpO2:  [90 %-100 %] 100 % (06/11 0807) Weight:  [60.9 kg] 60.9 kg (06/11 0500)     Height: 6\' 2"  (188 cm) Weight: 60.9 kg BMI (Calculated): 17.23   Physical Exam:  Constitutional: alert, cooperative and no distress  Respiratory: breathing non-labored at rest  Cardiovascular: regular rate and sinus rhythm  Gastrointestinal: soft, non-tender, and mild-distended  Labs:     Latest Ref Rng & Units 06/13/2022    6:25 AM 06/12/2022    5:43 AM 06/11/2022    7:53 PM  CBC  WBC 4.0 - 10.5 K/uL 4.7  4.2  4.7   Hemoglobin 13.0 - 17.0 g/dL 40.9  81.1  91.4   Hematocrit 39.0 - 52.0 % 36.0  35.1  39.0   Platelets 150 - 400 K/uL 164  157  168       Latest Ref Rng & Units 06/13/2022    6:25 AM 06/12/2022    4:45 AM 06/11/2022    7:53 PM  CMP  Glucose 70 - 99 mg/dL 782  956  213   BUN 8 - 23 mg/dL 16  20  21    Creatinine 0.61 - 1.24 mg/dL 0.86  5.78  4.69   Sodium 135 - 145 mmol/L 140  138  139   Potassium 3.5 - 5.1 mmol/L 3.5  4.2  4.4   Chloride 98 - 111 mmol/L 107  107  104   CO2 22 - 32 mmol/L 23  21  25    Calcium 8.9 - 10.3 mg/dL 8.4  8.6  9.2   Total Protein 6.5 - 8.1 g/dL  6.3  7.0   Total Bilirubin 0.3 - 1.2 mg/dL  0.7  0.8   Alkaline Phos  38 - 126 U/L  48  53   AST 15 - 41 U/L  22  21   ALT 0 - 44 U/L  11  12     Imaging studies: I personally evaluated the abdominal x-ray that was done after multiple insertion of NGT.  Persistent bowel distention.   Assessment/Plan:  89 y.o. male with small bowel obstruction, complicated by pertinent comorbidities including advanced dementia, diabetes, hypertension.   -Patient with adequate vital signs, no tachycardia or fever. -Abdominal exam improved, less abdominal distention -Unable to say if there is any gas or bowel movement -Will continue with conservative management.  Unfortunately orders were not specific to place NGT to low intermittent suction so patient had NGT placed and clamped without any suction.  Will put it to suction this morning and will consider doing Gastrografin tomorrow.  Gae Gallop, MD

## 2022-06-13 NOTE — Plan of Care (Signed)
  Problem: Metabolic: Goal: Ability to maintain appropriate glucose levels will improve Outcome: Progressing   Problem: Skin Integrity: Goal: Risk for impaired skin integrity will decrease Outcome: Progressing   Problem: Tissue Perfusion: Goal: Adequacy of tissue perfusion will improve Outcome: Progressing   Problem: Clinical Measurements: Goal: Will remain free from infection Outcome: Progressing Goal: Diagnostic test results will improve Outcome: Progressing Goal: Respiratory complications will improve Outcome: Progressing Goal: Cardiovascular complication will be avoided Outcome: Progressing   Problem: Nutrition: Goal: Adequate nutrition will be maintained Outcome: Progressing   Problem: Coping: Goal: Level of anxiety will decrease Outcome: Progressing   Problem: Elimination: Goal: Will not experience complications related to bowel motility Outcome: Progressing Goal: Will not experience complications related to urinary retention Outcome: Progressing   Problem: Pain Managment: Goal: General experience of comfort will improve Outcome: Progressing   Problem: Safety: Goal: Ability to remain free from injury will improve Outcome: Progressing   Problem: Skin Integrity: Goal: Risk for impaired skin integrity will decrease Outcome: Progressing

## 2022-06-13 NOTE — Consult Note (Signed)
Consultation Note Date: 06/13/2022   Patient Name: Matthew Goodwin  DOB: Aug 20, 1920  MRN: [id number]  Age / Sex: 89 y.o., male  PCP: Smiley Houseman, NP Referring Physician: Marrion Coy, MD  Reason for Consultation: Establishing goals of care  HPI/Patient Profile: 89 y.o. male   admitted on 06/11/2022 with medical history significant for presenting with complaints of nausea vomiting and generalized abdominal pain that started about a day prior.    With dementia with behavioral disturbances ,diabetes mellitus type 2, hypertension, BPH, anemia, UTI, GERD.  Patient is a resident of [facility].  He is followed by outpatient palliative program/[facility].   Continued physical, functional and cognitive decline over the past many years.  Pending decisions regarding treatment options, advanced directives and anticipatory care needs.       Clinical Assessment and Goals of Care:  This NP Lorinda Creed reviewed medical records, received report from team, assessed the patient and then spoke to [place] at facility, Barbee Cough NP with [facility] to discuss diagnosis, prognosis, GOC, EOL wishes disposition and options.    I spoke to patient's son/next of kin/medical decision maker/ Vasiliy Crear   Concept of Palliative Care was introduced as specialized medical care for people and their families living with serious illness.  If focuses on providing relief from the symptoms and stress of a serious illness.  The goal is to improve quality of life for both the patient and the family.   Values and goals of care important to patient and family were attempted to be elicited.   A  discussion was had today regarding advanced directives.  Concepts specific to code status, artifical feeding and hydration, continued IV antibiotics and rehospitalization was had.    The difference between a  aggressive medical intervention path  and a palliative comfort care path for this patient at this time was had.   Education offered on concept specific to human mortality and adult failure to thrive and the limitations of medical interventions to prolong quality of life when the body fails to thrive.     Natural trajectory and expectations at EOL were discussed.    Education offered on hospice benefit; philosophy and eligibility.  Patient is eligible for hospice services at this point in time.  After much conversation patient's son was unable to make any definitive decisions regarding treatment plan.     He asked to speak directly with the attending physician regarding medications that his father is on.  I made Dr Chipper Herb aware.  Questions and concerns addressed.    PMT will continue to support holistically.      SUMMARY OF RECOMMENDATIONS    Code Status/Advance Care Planning: DNR   Patient's son spoke to the importance of comfort but was unable to put in place any boundaries regarding  life-prolonging measures.    Palliative Prophylaxis:  Aspiration, Bowel Regimen, Delirium Protocol, Frequent Pain Assessment, and Oral Care   Psycho-social/Spiritual:  Desire for further Chaplaincy support:no Additional Recommendations: Education on Hospice  Prognosis:  Unable to determine  Discharge Planning: To Be Determined      Primary Diagnoses: Present on Admission:  Dementia with behavioral disturbance (HCC)  Benign essential HTN  UTI (urinary tract infection)  Chronic anemia  AKI (acute kidney injury) (HCC)  Sinus bradycardia  Electrolyte abnormality  BPH (benign prostatic hyperplasia)  GERD (gastroesophageal reflux disease)  Nausea and vomiting  SBO (small bowel obstruction) (HCC)   I have reviewed the medical record, interviewed the patient and family, and examined the patient. The following aspects are pertinent.  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   Hypertension    Hypoglycemia    Renal insufficiency    Urinary retention    Social History   Socioeconomic History   Marital status: Widowed    Spouse name: Not on file   Number of children: Not on file   Years of education: Not on file   Highest education level: Not on file  Occupational History   Not on file  Tobacco Use   Smoking status: Never    Passive exposure: Never   Smokeless tobacco: Never  Vaping Use   Vaping Use: Never used  Substance and Sexual Activity   Alcohol use: Not Currently   Drug use: Never   Sexual activity: Not Currently  Other Topics Concern   Not on file  Social History Narrative   Not on file   Social Determinants of Health   Financial Resource Strain: Not on file  Food Insecurity: No Food Insecurity (10/13/2021)   Hunger Vital Sign    Worried About Running Out of Food in the Last Year: Never true    Ran Out of Food in the Last Year: Never true  Transportation Needs: No Transportation Needs (10/13/2021)   PRAPARE - Administrator, Civil Service (Medical): No    Lack of Transportation (Non-Medical): No  Physical Activity: Not on file  Stress: Not on file  Social Connections: Not on file   Family History  Family history unknown: Yes   Scheduled Meds:  heparin  5,000 Units Subcutaneous Q8H   insulin aspart  0-9 Units Subcutaneous TID WC   sodium chloride flush  3 mL Intravenous Q12H   Continuous Infusions:  cefTRIAXone (ROCEPHIN)  IV Stopped (06/12/22 2233)   dextrose 5% lactated ringers with KCl 20 mEq/L 50 mL/hr at 06/13/22 0941   sodium chloride Stopped (06/11/22 2141)   PRN Meds:.acetaminophen **OR** acetaminophen, fluorometholone, hydrALAZINE, LORazepam, morphine injection, mouth rinse Medications Prior to  Admission:  Prior to Admission medications   Medication Sig Start Date End Date Taking? Authorizing Provider  acetaminophen (TYLENOL) 325 MG tablet Take 650 mg by mouth every 6 (six) hours as needed for mild pain or moderate pain.   Yes [provider]  atorvastatin (LIPITOR) 10 MG tablet Take 10 mg by mouth at bedtime.   Yes [provider]  bacitracin ointment Apply 1 Application topically 3 (three) times daily as needed for wound care (for infection/redness of skin around catheter/wound).   Yes [provider]  Chlorpheniramine-DM (CORICIDIN HBP COUGH/COLD PO) Take 1 tablet by mouth every 6 (six) hours as needed.   Yes [provider]  docusate sodium (COLACE) 100 MG capsule Take 100 mg by mouth daily.   Yes [provider]  famotidine (PEPCID) 20 MG tablet Take 20 mg by mouth at bedtime. 09/13/21  Yes [provider]  ferrous sulfate 325 (65 FE) MG tablet Take 325 mg by mouth daily.   Yes [provider]  fluorometholone (FML) 0.1 % ophthalmic suspension Place 1 drop into both eyes daily.   Yes [provider]  levocetirizine (XYZAL) 5 MG tablet Take 5 mg by mouth daily.    Yes [provider]  losartan (COZAAR) 50 MG tablet Take 50 mg by mouth daily.    Yes [provider]  melatonin 5 MG TABS Take 5 mg by mouth at bedtime. 12/07/21  Yes [provider]  metoprolol succinate (TOPROL-XL) 50 MG 24 hr tablet Take 50 mg by mouth at bedtime. Hold for pulse <50 09/13/21  Yes [provider]  Omega-3 Fatty Acids (FISH OIL) 1000 MG CAPS Take 1,000 mg by mouth 2 (two) times daily.   Yes [provider]  tamsulosin (FLOMAX) 0.4 MG CAPS capsule Take 0.4 mg by mouth daily.    Yes [provider]  Vitamin D, Ergocalciferol, (DRISDOL) 1.25 MG (50000 UNIT) CAPS capsule Take 50,000 Units by mouth every Wednesday.   Yes [provider]   No Known Allergies Review of Systems   Unable to perform ROS: Mental status change    Physical Exam Constitutional:      Appearance: He is cachectic. He is ill-appearing.  Cardiovascular:     Rate and Rhythm: Normal rate.  Pulmonary:     Effort: Pulmonary effort is normal.  Skin:    General: Skin is warm and dry.  Psychiatric:        Behavior: Behavior is uncooperative.     Vital Signs: BP (!) 143/68 (BP Location: Left Arm)   Pulse 65   Temp 98.4 F (36.9 C)   Resp 18   Ht 6\' 2"  (1.88 m)   Wt 60.9 kg   SpO2 100%   BMI 17.24 kg/m  Pain Scale: PAINAD POSS *See Group Information*: 1-Acceptable,Awake and alert Pain Score: 0-No pain   SpO2: SpO2: 100 % O2 Device:SpO2: 100 % O2 Flow Rate: .   IO: Intake/output summary:  Intake/Output Summary (Last 24 hours) at 06/13/2022 0959 Last data filed at 06/13/2022 1610 Gross per 24 hour  Intake 506.21 ml  Output 700 ml  Net -193.79 ml    LBM: Last BM Date :  (PTA) Baseline Weight: Weight: 83 kg Most recent weight: Weight: 60.9 kg     Palliative Assessment/Data:   30 % at best    Time:   75 minutes   Signed by: Lorinda Creed, NP   Please contact Palliative Medicine Team phone at [phone] for questions and concerns.  For individual provider: See Loretha Stapler

## 2022-06-13 NOTE — Progress Notes (Signed)
  Progress Note   Patient: Matthew Goodwin ZOX:[id number] DOB: 1920-09-10 DOA: 06/11/2022     1 DOS: the patient was seen and examined on 06/13/2022   Brief hospital course: Matthew Goodwin is a 89 y.o. male with medical history significant for diabetes mellitus type 2, hypertension, BPH, anemia, UTI presenting with complaints of nausea vomiting and generalized abdominal pain that started about a day prior.  CT scan showed small bowel obstruction.  Patient was placed n.p.o., IV fluids.  Patient was also consulted for surgery.  Due to poor prognosis, palliative care consult obtained.   Principal Problem:   Nausea and vomiting Active Problems:   DM type 2 (diabetes mellitus, type 2) (HCC)   GERD (gastroesophageal reflux disease)   Benign essential HTN   BPH (benign prostatic hyperplasia)   Dementia with behavioral disturbance (HCC)   Chronic anemia   UTI (urinary tract infection)   AKI (acute kidney injury) (HCC)   Sinus bradycardia   Electrolyte abnormality   SBO (small bowel obstruction) (HCC)   Assessment and Plan:  * Nausea and vomiting Small bowel obstruction. Patient placed on n.p.o., NG suction.  Followed by general surgery.  Dementia with behavioral disturbance [facility]) Patient is still very confused and agitated.  Due to advanced dementia and advanced age, patient has poor prognosis.  Palliative care consult obtained.  DM type 2 (diabetes mellitus, type 2) (HCC) Continue sliding scale insulin.  Acute kidney injury Hypokalemia Condition resolved after IV fluids.   GERD (gastroesophageal reflux disease) IV PPI therapy.  Benign essential HTN Continue to follow blood pressure.  BPH (benign prostatic hyperplasia) Continue to follow.  Possible UTI. Patient is a poor historian, not sure he has any urinary symptoms.  Urine culture was not sent out, he is currently on Rocephin, will continue for 3 days.  Severe protein calorie malnutrition Patient has significant muscle  atrophy, not able to give protein supplement due to n.p.o. status.      Subjective: Patient is agitated and Confused.  Physical Exam: Vitals:   06/13/22 0535 06/13/22 0755 06/13/22 0807 06/13/22 1107  BP:  (!) 160/88 (!) 143/68 (!) 196/99  Pulse: 63 67 65   Resp:  (!) 22 18   Temp: 98.2 F (36.8 C) 98 F (36.7 C) 98.4 F (36.9 C)   TempSrc: Oral Oral    SpO2: 100% 90% 100%   Weight:      Height:       General exam: Appears calm and comfortable, severely malnourished. Respiratory system: Clear to auscultation. Respiratory effort normal. Cardiovascular system: S1 & S2 heard, RRR. No JVD, murmurs, rubs, gallops or clicks. No pedal edema. Gastrointestinal system: Abdomen is nondistended, soft and nontender. No organomegaly or masses felt. Normal bowel sounds heard. Central nervous system: Agitated and confused. Extremities: Symmetric 5 x 5 power. Skin: No rashes, lesions or ulcers Psychiatry: Flat affect   Data Reviewed:  CT scan results, lab results.  Family Communication: Not able to get through  Disposition: Status is: Inpatient Remains inpatient appropriate because: Severity of disease, IV treatment.     Time spent: 35 minutes  Author: Marrion Coy, MD 06/13/2022 11:58 AM  For on call review [email or web address].

## 2022-06-14 ENCOUNTER — Inpatient Hospital Stay: Payer: Medicare Other

## 2022-06-14 DIAGNOSIS — K56609 Unspecified intestinal obstruction, unspecified as to partial versus complete obstruction: Secondary | ICD-10-CM | POA: Diagnosis not present

## 2022-06-14 DIAGNOSIS — N3000 Acute cystitis without hematuria: Secondary | ICD-10-CM | POA: Diagnosis not present

## 2022-06-14 DIAGNOSIS — F03918 Unspecified dementia, unspecified severity, with other behavioral disturbance: Secondary | ICD-10-CM | POA: Diagnosis not present

## 2022-06-14 LAB — BASIC METABOLIC PANEL
Anion gap: 9 (ref 5–15)
BUN: 12 mg/dL (ref 8–23)
CO2: 25 mmol/L (ref 22–32)
Calcium: 8.7 mg/dL — ABNORMAL LOW (ref 8.9–10.3)
Chloride: 107 mmol/L (ref 98–111)
Creatinine, Ser: 1.05 mg/dL (ref 0.61–1.24)
GFR, Estimated: >60 mL/min (ref >60)
Glucose, Bld: 120 mg/dL — ABNORMAL HIGH (ref 70–99)
Potassium: 3.7 mmol/L (ref 3.5–5.1)
Sodium: 141 mmol/L (ref 135–145)

## 2022-06-14 LAB — GLUCOSE, CAPILLARY
Glucose-Capillary: 101 mg/dL — ABNORMAL HIGH (ref 70–99)
Glucose-Capillary: 106 mg/dL — ABNORMAL HIGH (ref 70–99)
Glucose-Capillary: 106 mg/dL — ABNORMAL HIGH (ref 70–99)
Glucose-Capillary: 74 mg/dL (ref 70–99)
Glucose-Capillary: 76 mg/dL (ref 70–99)
Glucose-Capillary: 80 mg/dL (ref 70–99)
Glucose-Capillary: 83 mg/dL (ref 70–99)

## 2022-06-14 LAB — PHOSPHORUS: Phosphorus: 2.8 mg/dL (ref 2.5–4.6)

## 2022-06-14 LAB — MAGNESIUM: Magnesium: 2 mg/dL (ref 1.7–2.4)

## 2022-06-14 MED ORDER — DIATRIZOATE MEGLUMINE & SODIUM 66-10 % PO SOLN
90.0000 mL | Freq: Once | ORAL | Status: AC
  Administered 2022-06-14: 90 mL via NASOGASTRIC

## 2022-06-14 NOTE — Progress Notes (Signed)
Patient ID: Matthew Goodwin, male   DOB: [birth year], 89 y.o.   MRN: [id number]     SURGICAL PROGRESS NOTE   Hospital Day(s): 2.   Interval History: Patient seen and examined, no acute events or new complaints overnight. Patient not able to give history.  As per night nurse there was no issue overnight other than his baseline disorientation.  No nausea or vomiting.  No sign of gas or bowel movement.  Vital signs in last 24 hours: [min-max] current  Temp:  [98 F (36.7 C)-98.4 F (36.9 C)] 98.4 F (36.9 C) (06/12 0756) Pulse Rate:  [75-96] 84 (06/12 0756) Resp:  [16-18] 17 (06/12 0756) BP: (102-196)/(75-104) 107/75 (06/12 0756) SpO2:  [98 %-100 %] 98 % (06/12 0756) Weight:  [60 kg] 60 kg (06/12 0445)     Height: 6\' 2"  (188 cm) Weight: 60 kg BMI (Calculated): 16.98   Physical Exam:  Constitutional: alert, cooperative and no distress  Respiratory: breathing non-labored at rest  Cardiovascular: regular rate and sinus rhythm  Gastrointestinal: soft, non-tender, and non-distended  Labs:     Latest Ref Rng & Units 06/13/2022    6:25 AM 06/12/2022    5:43 AM 06/11/2022    7:53 PM  CBC  WBC 4.0 - 10.5 K/uL 4.7  4.2  4.7   Hemoglobin 13.0 - 17.0 g/dL 40.9  81.1  91.4   Hematocrit 39.0 - 52.0 % 36.0  35.1  39.0   Platelets 150 - 400 K/uL 164  157  168       Latest Ref Rng & Units 06/14/2022    4:48 AM 06/13/2022    6:25 AM 06/12/2022    4:45 AM  CMP  Glucose 70 - 99 mg/dL 782  956  213   BUN 8 - 23 mg/dL 12  16  20    Creatinine 0.61 - 1.24 mg/dL 0.86  5.78  4.69   Sodium 135 - 145 mmol/L 141  140  138   Potassium 3.5 - 5.1 mmol/L 3.7  3.5  4.2   Chloride 98 - 111 mmol/L 107  107  107   CO2 22 - 32 mmol/L 25  23  21    Calcium 8.9 - 10.3 mg/dL 8.7  8.4  8.6   Total Protein 6.5 - 8.1 g/dL   6.3   Total Bilirubin 0.3 - 1.2 mg/dL   0.7   Alkaline Phos 38 - 126 U/L   48   AST 15 - 41 U/L   22   ALT 0 - 44 U/L   11     Imaging studies: No new pertinent imaging  studies   Assessment/Plan:  89 y.o. male with small bowel obstruction, complicated by pertinent comorbidities including advanced dementia, diabetes, hypertension.   -No sign of clinical alteration.  No acute abdomen. -Adequate vital signs -Will order Gastrografin challenge for further evaluation of the obstruction since patient unable to give any history and no he is unable to say if he is passing gas.  No sign of bowel movement. -Continue n.p.o. -Continue IV fluids  Gae Gallop, MD

## 2022-06-14 NOTE — Progress Notes (Signed)
Progress Note   Patient: Matthew Goodwin ZOX:[id number] DOB: 1920/08/07 DOA: 06/11/2022     2 DOS: the patient was seen and examined on 06/14/2022   Brief hospital course: Matthew Goodwin is a 89 y.o. male with medical history significant for diabetes mellitus type 2, hypertension, BPH, anemia, UTI presenting with complaints of nausea vomiting and generalized abdominal pain that started about a day prior.  CT scan showed small bowel obstruction.  Patient was placed n.p.o., IV fluids.  Patient was also consulted for surgery.  Due to poor prognosis, palliative care consult obtained.   Principal Problem:   Nausea and vomiting Active Problems:   DM type 2 (diabetes mellitus, type 2) (HCC)   GERD (gastroesophageal reflux disease)   Benign essential HTN   BPH (benign prostatic hyperplasia)   Dementia with behavioral disturbance (HCC)   Chronic anemia   UTI (urinary tract infection)   AKI (acute kidney injury) (HCC)   Sinus bradycardia   Electrolyte abnormality   SBO (small bowel obstruction) (HCC)   Protein-calorie malnutrition, severe (HCC)   Assessment and Plan: * Nausea and vomiting Small bowel obstruction. Patient placed on n.p.o., NG suction.  Followed by general surgery. Patient still has not bowel movement.  Continue current treatment. On IV fluids. KUB tomorrow.  Dementia with behavioral disturbance [facility]) Patient is still very confused and agitated.  Due to advanced dementia and advanced age, patient has poor prognosis. Added Seroquel last night, slept better.   DM type 2 (diabetes mellitus, type 2) (HCC) Continue sliding scale insulin.   Acute kidney injury Hypokalemia Condition resolved after IV fluids.     GERD (gastroesophageal reflux disease) IV PPI therapy.   Benign essential HTN Continue to follow blood pressure.   BPH (benign prostatic hyperplasia) Continue to follow.   Possible UTI. Patient is a poor historian, not sure he has any urinary symptoms.   Urine culture was not sent out, he is currently on Rocephin, will continue for 3 days.   Severe protein calorie malnutrition Patient has significant muscle atrophy, not able to give protein supplement due to n.p.o. status.      Subjective: Patient slept well last night per nurse.  Still requiring as needed Haldol for agitation.  Physical Exam: Vitals:   06/14/22 0133 06/14/22 0412 06/14/22 0445 06/14/22 0756  BP: 131/87 102/75  107/75  Pulse: 89 85  84  Resp: 18 17  17   Temp: 98 F (36.7 C) 98.3 F (36.8 C)  98.4 F (36.9 C)  TempSrc: Oral     SpO2:  100%  98%  Weight:   60 kg   Height:       General exam: Appears calm and comfortable, cachectic. Respiratory system: Clear to auscultation. Respiratory effort normal. Cardiovascular system: S1 & S2 heard, RRR. No JVD, murmurs, rubs, gallops or clicks. No pedal edema. Gastrointestinal system: Abdomen is nondistended, soft and nontender. No organomegaly or masses felt. Normal bowel sounds heard. Central nervous system: Alert and confused. Extremities: Severe muscle atrophy. Skin: No rashes, lesions or ulcers Psychiatry: Flat affect.   Data Reviewed: {Tip this will not be part of the note when signed- Document your independent interpretation of telemetry tracing, EKG, lab, Radiology test or any other diagnostic tests. Add any new diagnostic test ordered today. Lab results reviewed.  Family Communication: Phone call to the son unanswered.  Disposition: Status is: Inpatient Remains inpatient appropriate because: Severity of disease, IV treatment.     Time spent: 35 minutes  Author: Marrion Coy, MD 06/14/2022  10:56 AM  For on call review [email or web address].

## 2022-06-14 NOTE — Plan of Care (Signed)
  Problem: Education: Goal: Individualized Educational Video(s) Outcome: Progressing   Problem: Metabolic: Goal: Ability to maintain appropriate glucose levels will improve Outcome: Progressing   Problem: Skin Integrity: Goal: Risk for impaired skin integrity will decrease Outcome: Progressing   Problem: Tissue Perfusion: Goal: Adequacy of tissue perfusion will improve Outcome: Progressing   Problem: Clinical Measurements: Goal: Will remain free from infection Outcome: Progressing Goal: Diagnostic test results will improve Outcome: Progressing Goal: Respiratory complications will improve Outcome: Progressing Goal: Cardiovascular complication will be avoided Outcome: Progressing   Problem: Nutrition: Goal: Adequate nutrition will be maintained Outcome: Progressing   Problem: Coping: Goal: Level of anxiety will decrease Outcome: Progressing   Problem: Elimination: Goal: Will not experience complications related to bowel motility Outcome: Progressing Goal: Will not experience complications related to urinary retention Outcome: Progressing   Problem: Pain Managment: Goal: General experience of comfort will improve Outcome: Progressing   Problem: Safety: Goal: Ability to remain free from injury will improve Outcome: Progressing   Problem: Skin Integrity: Goal: Risk for impaired skin integrity will decrease Outcome: Progressing

## 2022-06-15 ENCOUNTER — Inpatient Hospital Stay: Payer: Medicare Other

## 2022-06-15 DIAGNOSIS — K56609 Unspecified intestinal obstruction, unspecified as to partial versus complete obstruction: Secondary | ICD-10-CM | POA: Diagnosis not present

## 2022-06-15 DIAGNOSIS — R531 Weakness: Secondary | ICD-10-CM

## 2022-06-15 DIAGNOSIS — G9341 Metabolic encephalopathy: Secondary | ICD-10-CM | POA: Diagnosis not present

## 2022-06-15 DIAGNOSIS — I10 Essential (primary) hypertension: Secondary | ICD-10-CM

## 2022-06-15 LAB — GLUCOSE, CAPILLARY
Glucose-Capillary: 80 mg/dL (ref 70–99)
Glucose-Capillary: 113 mg/dL — ABNORMAL HIGH (ref 70–99)
Glucose-Capillary: 116 mg/dL — ABNORMAL HIGH (ref 70–99)
Glucose-Capillary: 103 mg/dL — ABNORMAL HIGH (ref 70–99)

## 2022-06-15 LAB — BASIC METABOLIC PANEL
Anion gap: 8 (ref 5–15)
BUN: 16 mg/dL (ref 8–23)
CO2: 24 mmol/L (ref 22–32)
Calcium: 9.2 mg/dL (ref 8.9–10.3)
Chloride: 112 mmol/L — ABNORMAL HIGH (ref 98–111)
Creatinine, Ser: 1.1 mg/dL (ref 0.61–1.24)
GFR, Estimated: 60 mL/min — ABNORMAL LOW (ref >60)
Glucose, Bld: 111 mg/dL — ABNORMAL HIGH (ref 70–99)
Potassium: 4.9 mmol/L (ref 3.5–5.1)
Sodium: 144 mmol/L (ref 135–145)

## 2022-06-15 LAB — PHOSPHORUS: Phosphorus: 2.4 mg/dL — ABNORMAL LOW (ref 2.5–4.6)

## 2022-06-15 LAB — MAGNESIUM: Magnesium: 2.1 mg/dL (ref 1.7–2.4)

## 2022-06-15 MED ORDER — QUETIAPINE FUMARATE 25 MG PO TABS
25.0000 mg | ORAL_TABLET | Freq: Every day | ORAL | Status: DC
  Administered 2022-06-15: 25 mg via ORAL
  Filled 2022-06-15: qty 1

## 2022-06-15 MED ORDER — TAMSULOSIN HCL 0.4 MG PO CAPS
0.4000 mg | ORAL_CAPSULE | Freq: Every day | ORAL | Status: DC
  Administered 2022-06-15 – 2022-06-17 (×3): 0.4 mg via ORAL
  Filled 2022-06-15 (×3): qty 1

## 2022-06-15 MED ORDER — LOSARTAN POTASSIUM 50 MG PO TABS
50.0000 mg | ORAL_TABLET | Freq: Every day | ORAL | Status: DC
  Administered 2022-06-15 – 2022-06-17 (×3): 50 mg via ORAL
  Filled 2022-06-15 (×3): qty 1

## 2022-06-15 MED ORDER — THIAMINE MONONITRATE 100 MG PO TABS
100.0000 mg | ORAL_TABLET | Freq: Every day | ORAL | Status: DC
  Administered 2022-06-15 – 2022-06-17 (×3): 100 mg via ORAL
  Filled 2022-06-15 (×3): qty 1

## 2022-06-15 MED ORDER — ENSURE ENLIVE PO LIQD
237.0000 mL | Freq: Three times a day (TID) | ORAL | Status: DC
  Administered 2022-06-15 – 2022-06-17 (×5): 237 mL via ORAL

## 2022-06-15 MED ORDER — METOPROLOL SUCCINATE ER 50 MG PO TB24
50.0000 mg | ORAL_TABLET | Freq: Every day | ORAL | Status: DC
  Administered 2022-06-15 – 2022-06-17 (×3): 50 mg via ORAL
  Filled 2022-06-15 (×3): qty 1

## 2022-06-15 MED ORDER — ADULT MULTIVITAMIN W/MINERALS CH
1.0000 | ORAL_TABLET | Freq: Every day | ORAL | Status: DC
  Administered 2022-06-15 – 2022-06-17 (×3): 1 via ORAL
  Filled 2022-06-15 (×3): qty 1

## 2022-06-15 MED ORDER — PANTOPRAZOLE SODIUM 40 MG PO TBEC
40.0000 mg | DELAYED_RELEASE_TABLET | Freq: Every day | ORAL | Status: DC
  Administered 2022-06-15 – 2022-06-16 (×2): 40 mg via ORAL
  Filled 2022-06-15 (×3): qty 1

## 2022-06-15 NOTE — Progress Notes (Signed)
Patient ID: Matthew Goodwin, male   DOB: 1920/06/28, 89 y.o.   MRN: [id number]     SURGICAL PROGRESS NOTE   Hospital Day(s): 3.   Interval History: Patient seen and examined, no acute events or new complaints overnight. Patient unable to give history due to baseline dementia.  No issues reported per nurse.  Close she shows a large bowel movement last night.  Abdominal x-ray shows no normal bowel pattern, no sign of obstruction.  Adequately Gastrografin in large intestine.  Vital signs in last 24 hours: [min-max] current  Temp:  [97.7 F (36.5 C)-98.8 F (37.1 C)] 97.7 F (36.5 C) (06/13 0612) Pulse Rate:  [74-84] 74 (06/13 0612) Resp:  [16-19] 19 (06/13 0612) BP: (107-149)/(75-101) 149/93 (06/13 0612) SpO2:  [75 %-100 %] 100 % (06/13 0612)     Height: 6\' 2"  (188 cm) Weight: 60 kg BMI (Calculated): 16.98   Physical Exam:  Constitutional: alert, cooperative and no distress  Respiratory: breathing non-labored at rest  Cardiovascular: regular rate and sinus rhythm  Gastrointestinal: soft, non-tender, and non-distended  Labs:     Latest Ref Rng & Units 06/13/2022    6:25 AM 06/12/2022    5:43 AM 06/11/2022    7:53 PM  CBC  WBC 4.0 - 10.5 K/uL 4.7  4.2  4.7   Hemoglobin 13.0 - 17.0 g/dL 40.9  81.1  91.4   Hematocrit 39.0 - 52.0 % 36.0  35.1  39.0   Platelets 150 - 400 K/uL 164  157  168       Latest Ref Rng & Units 06/15/2022    4:35 AM 06/14/2022    4:48 AM 06/13/2022    6:25 AM  CMP  Glucose 70 - 99 mg/dL 782  956  213   BUN 8 - 23 mg/dL 16  12  16    Creatinine 0.61 - 1.24 mg/dL 0.86  5.78  4.69   Sodium 135 - 145 mmol/L 144  141  140   Potassium 3.5 - 5.1 mmol/L 4.9  3.7  3.5   Chloride 98 - 111 mmol/L 112  107  107   CO2 22 - 32 mmol/L 24  25  23    Calcium 8.9 - 10.3 mg/dL 9.2  8.7  8.4     Imaging studies: Abdominal x-ray last night shows adequate Gastrografin in large intestine.  No bowel obstruction pattern.  I personally evaluated the images   Assessment/Plan:  89 y.o.  male with small bowel obstruction, complicated by pertinent comorbidities including advanced dementia, diabetes, hypertension.   -Bowel obstruction resolving -Gastrografin seen in large intestine.  Patient had a large bowel movement last night. -Will discontinue NGT and advance diet as tolerated   Gae Gallop, MD

## 2022-06-15 NOTE — Progress Notes (Addendum)
Initial Nutrition Assessment  DOCUMENTATION CODES:   Underweight, Severe malnutrition in context of social or environmental circumstances  INTERVENTION:   -RD will follow for diet advancement and adjust supplement regimen as appropriate -MVI with minerals daily -Ensure Enlive po TID, each supplement provides 350 kcal and 20 grams of protein -100 mg thiamine daily x 5 days secondary to high refeeding risk -Monitor Mg, K and Phos and replete as needed secondary to high refeeding risk  NUTRITION DIAGNOSIS:   Severe Malnutrition related to social / environmental circumstances as evidenced by moderate fat depletion, severe fat depletion, moderate muscle depletion, severe muscle depletion, percent weight loss.  GOAL:   Patient will meet greater than or equal to 90% of their needs  MONITOR:   PO intake, Supplement acceptance, Diet advancement  REASON FOR ASSESSMENT:   Low Braden    ASSESSMENT:   Pt with medical history significant for diabetes mellitus type 2, hypertension, BPH, anemia, UTI presenting with complaints of nausea vomiting and generalized abdominal pain that started about a day prior.  Pt admitted with SBO.   6/10- NGT placed for decompression  Reviewed I/O's: -10 ml x 24 hours and +766 ml since admission  Per general surgery notes, SBO resolved. Plan to d/c NGT and advance diet.   Case discussed with nurse tech, who reports pt is feeling hungry and cold. Warm blanket provided.   Spoke with pt at bedside, who did not offer much history other than thanking nurse tech for warm blanket. Noted NGT still in place at time of visit.    Reviewed wt hx; pt has experienced a 28.4% wt loss over the past 8 months, which is significant for time frame.   Per MD notes, pt with poor prognosis. Palliative care consult pending.   Lab Results  Component Value Date   HGBA1C 5.5 10/12/2021   PTA DM medications are none.   Labs reviewed: CBGS: 80 (inpatient orders for glycemic  control are 0-9 units insulin aspart TID with meals).    NUTRITION - FOCUSED PHYSICAL EXAM:  Flowsheet Row Most Recent Value  Orbital Region Moderate depletion  Upper Arm Region Severe depletion  Thoracic and Lumbar Region Moderate depletion  Buccal Region Severe depletion  Temple Region Severe depletion  Clavicle Bone Region Moderate depletion  Clavicle and Acromion Bone Region Moderate depletion  Scapular Bone Region Moderate depletion  Dorsal Hand Moderate depletion  Patellar Region Severe depletion  Anterior Thigh Region Severe depletion  Posterior Calf Region Severe depletion  Edema (RD Assessment) None  Hair Reviewed  Eyes Reviewed  Mouth Reviewed  Skin Reviewed  Nails Reviewed       Diet Order:   Diet Order             Diet full liquid Fluid consistency: Thin  Diet effective now                   EDUCATION NEEDS:   Not appropriate for education at this time  Skin:  Skin Assessment: Reviewed RN Assessment  Last BM:  06/14/22 (type 7)  Height:   Ht Readings from Last 1 Encounters:  06/11/22 6\' 2"  (1.88 m)    Weight:   Wt Readings from Last 1 Encounters:  06/14/22 60 kg    Ideal Body Weight:  86.4 kg  BMI:  Body mass index is 16.98 kg/m.  Estimated Nutritional Needs:   Kcal:  1800-2000  Protein:  90-105 grams  Fluid:  > 1.8 L    Amiayah Giebel W,  RD, LDN, [place] Registered Dietitian II Certified Diabetes Care and Education Specialist Please refer to [facility] for RD and/or RD on-call/weekend/after hours pager

## 2022-06-15 NOTE — Progress Notes (Signed)
Patient ID: Matthew Goodwin, male   DOB: 11-08-1920, 89 y.o.   MRN: [id number]    Progress Note from the Palliative Medicine Team at [facility]   Patient Name: Matthew Goodwin        Date: 06/15/2022 DOB: [birth year]  Age: 89 y.o. MRN#: [id number] Attending Physician: Marrion Coy, MD Primary Care Physician: Smiley Houseman, NP Admit Date: 06/11/2022    Extensive chart review has been completed prior to meeting with patient/family  including labs, vital signs, imaging, progress/consult notes, orders, medications and available advance directive documents.    This NP assessed patient at the bedside as a follow up for palliative medicine needs and emotional support.  Patient is more alert today and tolerating diet well.   No complaints voiced currently  Spoke with Dr. Chipper Herb and plan is for discharge back to his facility tomorrow morning  Recommend hospice services at his group home.  Attempted to contact son Khian Remo without success, left voice message will await callback  PMT will continue to support holistically    Time: 25 minutes  Detailed review of medical records ( labs, imaging, vital signs), medically appropriate exam ( MS, skin, resp)   discussed with treatment team, counseling and education to patient, family, staff, documenting clinical information, medication management, coordination of care    Lorinda Creed NP  Palliative Medicine Team Team Phone # [phone] Pager [phone]

## 2022-06-15 NOTE — Care Management Important Message (Signed)
Important Message  Patient Details  Name: Matthew Goodwin MRN: [id number] Date of Birth: 28-Jul-1920   Medicare Important Message Given:  N/A - LOS <3 / Initial given by admissions     Olegario Messier A [name] 06/15/2022, 10:48 AM

## 2022-06-15 NOTE — Progress Notes (Signed)
Progress Note   Patient: Matthew Goodwin:[id number] DOB: [birth year] DOA: 06/11/2022     3 DOS: the patient was seen and examined on 06/15/2022   Brief hospital course: Matthew Goodwin is a 89 y.o. male with medical history significant for diabetes mellitus type 2, hypertension, BPH, anemia, UTI presenting with complaints of nausea vomiting and generalized abdominal pain that started about a day prior.  CT scan showed small bowel obstruction.  Patient was placed n.p.o., IV fluids.  Patient was also consulted for surgery.  Due to poor prognosis, palliative care consult obtained. Patient condition finally improved, had a bowel movement.  Diet started per general surgery.   Principal Problem:   Nausea and vomiting Active Problems:   DM type 2 (diabetes mellitus, type 2) (HCC)   GERD (gastroesophageal reflux disease)   Benign essential HTN   BPH (benign prostatic hyperplasia)   Dementia with behavioral disturbance (HCC)   Chronic anemia   UTI (urinary tract infection)   AKI (acute kidney injury) (HCC)   Sinus bradycardia   Electrolyte abnormality   SBO (small bowel obstruction) (HCC)   Protein-calorie malnutrition, severe (HCC)   Acute metabolic encephalopathy   Assessment and Plan:  * Nausea and vomiting Small bowel obstruction. Patient placed on n.p.o., NG suction.  Followed by general surgery. Patient had a bowel movement yesterday, repeat KUB had improved.  Liquid diet started per general surgery, discontinue IV fluids.   Dementia with behavioral disturbance (HCC) Acute metabolic encephalopathy. Patient was very confused and agitated up until yesterday.  Requiring as needed Haldol.  Condition much improved today, he is alert and oriented x 2 now.  Condition is more consistent with acute metabolic encephalopathy with baseline dementia. Continue to follow. Discussed with palliative care, patient still has poor prognosis due to advanced age.  Will make referral to hospice after  discharge from hospital.   DM type 2 (diabetes mellitus, type 2) (HCC) Continue sliding scale insulin.   Acute kidney injury Hypokalemia Condition improved, IV fluids discontinued.     GERD (gastroesophageal reflux disease) Change PPI to oral.   Benign essential HTN Restart home medicine with losartan and metoprolol.   BPH (benign prostatic hyperplasia) Continue to follow.   Possible UTI. Patient is a poor historian, not sure he has any urinary symptoms.  Urine culture was not sent out. Patient has completed 3 days of Rocephin, discontinued.   Severe protein calorie malnutrition Start Ensure.     Subjective:  Patient doing much better, no abdominal pain nausea vomiting.  Less confusion.  Physical Exam: Vitals:   06/14/22 1717 06/14/22 2113 06/15/22 0612 06/15/22 0802  BP: (!) 141/81 (!) 142/101 (!) 149/93 (!) 164/102  Pulse: 82 75 74 79  Resp: 16 18 19 18   Temp: 98.8 F (37.1 C) 98.1 F (36.7 C) 97.7 F (36.5 C) 97.8 F (36.6 C)  TempSrc:      SpO2:  (!) 75% 100% 100%  Weight:      Height:       General exam: Appears calm and comfortable  Respiratory system: Clear to auscultation. Respiratory effort normal. Cardiovascular system: S1 & S2 heard, RRR. No JVD, murmurs, rubs, gallops or clicks. No pedal edema. Gastrointestinal system: Abdomen is nondistended, soft and nontender. No organomegaly or masses felt. Normal bowel sounds heard. Central nervous system: Alert and oriented x2. No focal neurological deficits. Extremities: Symmetric 5 x 5 power. Skin: No rashes, lesions or ulcers Psychiatry: Judgement and insight appear normal. Mood & affect appropriate.  Data Reviewed:  Lab results reviewed.  Family Communication: Son updated at bedside.  Disposition: Status is: Inpatient Remains inpatient appropriate because: Severity of disease,     Time spent: 55 minutes  Author: Marrion Coy, MD 06/15/2022 10:36 AM  For on call review  [email or web address].

## 2022-06-16 DIAGNOSIS — G9341 Metabolic encephalopathy: Secondary | ICD-10-CM | POA: Diagnosis not present

## 2022-06-16 DIAGNOSIS — E870 Hyperosmolality and hypernatremia: Secondary | ICD-10-CM | POA: Insufficient documentation

## 2022-06-16 DIAGNOSIS — F03918 Unspecified dementia, unspecified severity, with other behavioral disturbance: Secondary | ICD-10-CM | POA: Diagnosis not present

## 2022-06-16 DIAGNOSIS — K56609 Unspecified intestinal obstruction, unspecified as to partial versus complete obstruction: Secondary | ICD-10-CM | POA: Diagnosis not present

## 2022-06-16 LAB — BASIC METABOLIC PANEL
Anion gap: 8 (ref 5–15)
BUN: 20 mg/dL (ref 8–23)
CO2: 26 mmol/L (ref 22–32)
Calcium: 9.7 mg/dL (ref 8.9–10.3)
Chloride: 112 mmol/L — ABNORMAL HIGH (ref 98–111)
Creatinine, Ser: 1.07 mg/dL (ref 0.61–1.24)
GFR, Estimated: >60 mL/min (ref >60)
Glucose, Bld: 122 mg/dL — ABNORMAL HIGH (ref 70–99)
Potassium: 4.3 mmol/L (ref 3.5–5.1)
Sodium: 146 mmol/L — ABNORMAL HIGH (ref 135–145)

## 2022-06-16 LAB — GLUCOSE, CAPILLARY
Glucose-Capillary: 101 mg/dL — ABNORMAL HIGH (ref 70–99)
Glucose-Capillary: 106 mg/dL — ABNORMAL HIGH (ref 70–99)
Glucose-Capillary: 110 mg/dL — ABNORMAL HIGH (ref 70–99)
Glucose-Capillary: 121 mg/dL — ABNORMAL HIGH (ref 70–99)
Glucose-Capillary: 123 mg/dL — ABNORMAL HIGH (ref 70–99)
Glucose-Capillary: 128 mg/dL — ABNORMAL HIGH (ref 70–99)

## 2022-06-16 LAB — MAGNESIUM: Magnesium: 2.2 mg/dL (ref 1.7–2.4)

## 2022-06-16 LAB — PHOSPHORUS: Phosphorus: 3 mg/dL (ref 2.5–4.6)

## 2022-06-16 MED ORDER — SODIUM CHLORIDE 0.45 % IV SOLN: INTRAVENOUS | Status: DC

## 2022-06-16 MED ORDER — HALOPERIDOL LACTATE 5 MG/ML IJ SOLN
2.0000 mg | Freq: Four times a day (QID) | INTRAMUSCULAR | Status: DC | PRN
  Administered 2022-06-16: 2 mg via INTRAVENOUS
  Filled 2022-06-16: qty 1

## 2022-06-16 MED ORDER — MELATONIN 5 MG PO TABS
2.5000 mg | ORAL_TABLET | Freq: Every day | ORAL | Status: DC
  Administered 2022-06-16: 2.5 mg via ORAL
  Filled 2022-06-16: qty 1

## 2022-06-16 NOTE — TOC Initial Note (Addendum)
Transition of Care [facility]) - Initial/Assessment Note    Patient Details  Name: Matthew Goodwin MRN: [id number] Date of Birth: 22-Jul-1920  [facility] CM/SW Contact:    Allena Katz, LCSW Phone Number: 06/16/2022, 12:06 PM  Clinical Narrative:   Pt admitted from [facility]. Pt admitted with SBO and is on a soft diet currently. Mental status worsening this morning and pt started on PRN Haldol. Palliative following outpatient.   TOC following for care plan updates.                 Expected Discharge Plan: Assisted Living Barriers to Discharge: Continued Medical Work up   Patient Goals and CMS Choice            Expected Discharge Plan and Services                                           Representative spoke with at [facility] Agency: N/A  Prior Living Arrangements/Services   Lives with:: Facility Resident   Do you feel safe going back to the place where you live?: Yes               Activities of Daily Living      Permission Sought/Granted                  Emotional Assessment       Orientation: : Fluctuating Orientation (Suspected and/or reported Sundowners) Alcohol / Substance Use: Not Applicable    Admission diagnosis:  Lower urinary tract infectious disease [N39.0] Small bowel obstruction (HCC) [K56.609] Nausea and vomiting [R11.2] SBO (small bowel obstruction) (HCC) [K56.609] Patient Active Problem List   Diagnosis Date Noted   Hypernatremia 06/16/2022   Acute metabolic encephalopathy 06/15/2022   Protein-calorie malnutrition, severe (HCC) 06/13/2022   AKI (acute kidney injury) (HCC) 06/12/2022   Sinus bradycardia 06/12/2022   Electrolyte abnormality 06/12/2022   Nausea and vomiting 06/12/2022   SBO (small bowel obstruction) (HCC) 06/12/2022   Generalized weakness 12/13/2021   UTI (urinary tract infection) 12/13/2021   Chronic anemia 10/16/2021   Dementia with behavioral disturbance (HCC) 10/16/2021   GERD  (gastroesophageal reflux disease) 10/12/2021   BPH (benign prostatic hyperplasia) 10/12/2021   Closed compression fracture of L2 vertebra (HCC) 05/07/2019   Back pain 05/06/2019   Closed compression fracture of body of L1 vertebra (HCC) 05/06/2019   Acute lower UTI 05/06/2019   DM type 2 (diabetes mellitus, type 2) (HCC) 05/06/2019   Aortic stenosis 05/06/2019   Benign essential HTN 05/06/2019   PCP:  Smiley Houseman, NP Pharmacy:   [facility] - [place], [place] - [address] [place] [address] Phone: [phone] Fax: [phone]     Social Determinants of Health (SDOH) Social History: SDOH Screenings   Food Insecurity: No Food Insecurity (10/13/2021)  Housing: Low Risk  (10/13/2021)  Transportation Needs: No Transportation Needs (10/13/2021)  Utilities: Not At Risk (10/13/2021)  Tobacco Use: Low Risk  (06/12/2022)   SDOH Interventions:     Readmission Risk Interventions     No data to display

## 2022-06-16 NOTE — Progress Notes (Signed)
Patient noted to be coughing after drinking thin liquids during med pass.   RN notified [facility].  Order received: perform bedside swallow test  Bedside swallow completed, patient failed, he stopped while drinking the 3oz he also had a wet voice after drinking.   RN prompted to keep patient NPO and place SLP eval.

## 2022-06-16 NOTE — Evaluation (Addendum)
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 male with medical history significant for diabetes mellitus type 2, hypertension, BPH, anemia, UTI presenting with complaints of nausea vomiting and generalized abdominal pain that started about a day prior.  Per report patient's roommate had also had vomiting patient himself is a limited historian.  HPI as per chart review and ED provider note review.  Pt is currently being followed by a thorough palliative care.    Assessment / Plan / Recommendation  Clinical Impression  Pt was awake, alert with bilateral mitts (d/t report of baseline dementia) on upon SLP arrival. Pt presents with adequate oropharyngeal abilities when consuming thin liquids via cup and straw, puree and regular solids. As such he appears at reduced risk of aspiration when following general aspiration precautions. Given pt's current confusion related to baseline dementia he requires full supervision during all PO consumption to ensure that genera aspiration precautions are followed.    In conclusion, recommend dysphagia 2 diet (d/t prolonged mastication and lack of dentition) with thin liquids via cup or straw, medicine crushed in puree. Further ST services are not indicated at this  time.  SLP Visit Diagnosis: Dysphagia, unspecified (R13.10)    Aspiration Risk  Mild aspiration risk (d/t dementia)    Diet Recommendation Dysphagia 2 (Fine chop)   Liquid Administration via: Cup;Straw Medication Administration: Crushed with puree Supervision: Patient able to self feed Postural Changes: Seated upright at 90 degrees    Other  Recommendations Oral Care Recommendations: Staff/trained caregiver to provide oral care;Oral care BID    Recommendations for follow up therapy are one component of a multi-disciplinary discharge planning process, led by the attending physician.  Recommendations may be updated based on patient status, additional functional criteria and insurance authorization.  Follow up Recommendations No SLP follow up      Assistance Recommended at Discharge  N/A  Functional Status Assessment Patient has not had a recent decline in their functional status  Frequency and Duration   N/A         Prognosis   N/A     Swallow Study   General Date of Onset: 06/11/22 HPI: Matthew Goodwin is a 89 y.o. male with medical history significant for diabetes mellitus type 2, hypertension, BPH, anemia, UTI presenting with complaints of nausea vomiting and generalized abdominal pain that started about a day prior.  Per report patient's roommate had also had vomiting patient himself is a limited historian.  HPI as per chart review and ED provider note review.  Pt is currently being followed by a thorough palliative care. Type of Study: Bedside Swallow Evaluation Previous Swallow Assessment: N/A Diet Prior to this Study: NPO Temperature Spikes Noted: No Respiratory Status: Room air History of Recent Intubation: No Behavior/Cognition: Alert  Oral Cavity Assessment: Within Functional Limits Oral Care Completed by SLP: No Oral Cavity - Dentition: Missing dentition;Edentulous;Other (Comment) (Pt has one tooth on the top and several teeth on the bottom) Vision: Functional for  self-feeding Self-Feeding Abilities: Needs assist;Able to feed self;Needs set up Patient Positioning: Upright in bed Baseline Vocal Quality: Normal Volitional Cough: Strong Volitional Swallow: Able to elicit    Oral/Motor/Sensory Function Overall Oral Motor/Sensory Function: Within functional limits   Ice Chips Ice chips: Not tested   Thin Liquid Thin Liquid: Within functional limits Presentation: Cup;Straw Other Comments: Pt is able to tolerate thin liquids    Nectar Thick Nectar Thick Liquid: Within functional limits Presentation: Straw;Cup Other Comments: Pt is able to tolerate nectar thick liquids when reminded to slow down on his sips   Honey Thick Honey Thick Liquid: Not tested   Puree Puree: Within functional limits Presentation: Spoon;Self Fed Other Comments: Pt is able to tolerate puree consistency   Solid     Solid: Within functional limits Presentation: Spoon;Self Fed Other Comments: Pt is able to tolerate solids well; increased mastication w/ solids was observed.      Matthew Goodwin 06/16/2022,2:24 PM

## 2022-06-16 NOTE — Progress Notes (Signed)
Patient ID: Matthew Goodwin, male   DOB: [birth year], 89 y.o.   MRN: [id number]     SURGICAL PROGRESS NOTE   Hospital Day(s): 4.   Interval History: Patient seen and examined, no acute events or new complaints overnight. Patient unable to give history due to baseline dementia.  He is alert and cooperative.  He seems to be in no distress or any pain.  As per nurse there has been no complaints.  He was able to tolerate soft diet yesterday.  He has multiple bowel movement yesterday.  Vital signs in last 24 hours: [min-max] current  Temp:  [97.5 F (36.4 C)-98.6 F (37 C)] 98.5 F (36.9 C) (06/14 0415) Pulse Rate:  [74-91] 87 (06/14 0415) Resp:  [16-20] 16 (06/14 0415) BP: (136-164)/(97-105) 142/97 (06/14 0415) SpO2:  [100 %] 100 % (06/14 0415) Weight:  [59 kg] 59 kg (06/14 0500)     Height: 6\' 2"  (188 cm) Weight: 59 kg BMI (Calculated): 16.69   Physical Exam:  Constitutional: alert, cooperative and no distress  Respiratory: breathing non-labored at rest  Cardiovascular: regular rate and sinus rhythm  Gastrointestinal: soft, non-tender, and non-distended  Labs:     Latest Ref Rng & Units 06/13/2022    6:25 AM 06/12/2022    5:43 AM 06/11/2022    7:53 PM  CBC  WBC 4.0 - 10.5 K/uL 4.7  4.2  4.7   Hemoglobin 13.0 - 17.0 g/dL 78.2  95.6  21.3   Hematocrit 39.0 - 52.0 % 36.0  35.1  39.0   Platelets 150 - 400 K/uL 164  157  168       Latest Ref Rng & Units 06/16/2022    4:43 AM 06/15/2022    4:35 AM 06/14/2022    4:48 AM  CMP  Glucose 70 - 99 mg/dL 086  578  469   BUN 8 - 23 mg/dL 20  16  12    Creatinine 0.61 - 1.24 mg/dL 6.29  5.28  4.13   Sodium 135 - 145 mmol/L 146  144  141   Potassium 3.5 - 5.1 mmol/L 4.3  4.9  3.7   Chloride 98 - 111 mmol/L 112  112  107   CO2 22 - 32 mmol/L 26  24  25    Calcium 8.9 - 10.3 mg/dL 9.7  9.2  8.7     Imaging studies: No new pertinent imaging studies   Assessment/Plan:  89 y.o. male with small bowel obstruction, complicated by pertinent  comorbidities including advanced dementia, diabetes, hypertension.   -Small bowel resolved.  Patient tolerated soft diet yesterday -Patient has had multiple bowel movements -Abdominal physical exam is benign with soft abdomen, nondistended. -From surgical standpoint patient can be discharged.  No surgical follow-up needed as outpatient   Gae Gallop, MD

## 2022-06-16 NOTE — Care Management Important Message (Signed)
Important Message  Patient Details  Name: Matthew Goodwin MRN: [id number] Date of Birth: December 30, 1920   Medicare Important Message Given:  N/A - LOS <3 / Initial given by admissions     Olegario Messier A Lesslie Mossa 06/16/2022, 8:13 AM

## 2022-06-16 NOTE — Progress Notes (Signed)
Progress Note   Patient: Matthew Goodwin UJW:[id number] DOB: [birth year] DOA: 06/11/2022     4 DOS: the patient was seen and examined on 06/16/2022   Brief hospital course: Matthew Goodwin is a 89 y.o. male with medical history significant for diabetes mellitus type 2, hypertension, BPH, anemia, UTI presenting with complaints of nausea vomiting and generalized abdominal pain that started about a day prior.  CT scan showed small bowel obstruction.  Patient was placed n.p.o., IV fluids.  Patient was also consulted for surgery.  Due to poor prognosis, palliative care consult obtained. Patient condition finally improved, had a bowel movement.  Diet started per general surgery. 6/14.  SBO resolved, but patient developed confusion, dysphagia.   Principal Problem:   Nausea and vomiting Active Problems:   DM type 2 (diabetes mellitus, type 2) (HCC)   GERD (gastroesophageal reflux disease)   Benign essential HTN   BPH (benign prostatic hyperplasia)   Dementia with behavioral disturbance (HCC)   Chronic anemia   UTI (urinary tract infection)   AKI (acute kidney injury) (HCC)   Sinus bradycardia   Electrolyte abnormality   SBO (small bowel obstruction) (HCC)   Protein-calorie malnutrition, severe (HCC)   Acute metabolic encephalopathy   Hypernatremia   Assessment and Plan: Nausea and vomiting Small bowel obstruction. Patient placed on n.p.o., NG suction.  Followed by general surgery. Condition has resolved, patient had multiple bowel movements.   Dementia with behavioral disturbance (HCC) Acute metabolic encephalopathy. Patient has a worsening mental status this morning, and was agitated.  Restarted as needed Haldol.  It appears that Seroquel did not make it better so I will discontinue it. I do not have a concern for recurrence of UTI as patient just completed antibiotics yesterday. Patient also had dysphagia, seen by speech therapy, placed on dysphagia 2 diet.   DM type 2 (diabetes  mellitus, type 2) (HCC) Continue sliding scale insulin.   Acute kidney injury Hypokalemia Condition improved, IV fluids discontinued.     GERD (gastroesophageal reflux disease) Change PPI to oral.   Benign essential HTN Restart home medicine with losartan and metoprolol.   BPH (benign prostatic hyperplasia) Continue to follow.   Possible UTI. Patient is a poor historian, not sure he has any urinary symptoms.  Urine culture was not sent out. Patient has completed 3 days of Rocephin.   Severe protein calorie malnutrition Start Ensure.        Subjective:  Patient was confused and agitated this morning.  He did not sleep well last night.  Physical Exam: Vitals:   06/16/22 0415 06/16/22 0500 06/16/22 0816 06/16/22 1100  BP: (!) 142/97  (!) 147/97 (!) 183/112  Pulse: 87  81 80  Resp: 16  16   Temp: 98.5 F (36.9 C)  98.1 F (36.7 C)   TempSrc:      SpO2: 100%  100%   Weight:  59 kg    Height:       General exam: Appears calm and comfortable  Respiratory system: Clear to auscultation. Respiratory effort normal. Cardiovascular system: S1 & S2 heard, RRR. No JVD, murmurs, rubs, gallops or clicks. No pedal edema. Gastrointestinal system: Abdomen is nondistended, soft and nontender. No organomegaly or masses felt. Normal bowel sounds heard. Central nervous system: Alert and confused. Extremities: Symmetric 5 x 5 power. Skin: No rashes, lesions or ulcers Psychiatry:  Mood & affect appropriate.    Data Reviewed:  Lab results reviewed.  Family Communication: None  Disposition: Status is: Inpatient Remains inpatient appropriate  because: Severity of disease, altered mental status.     Time spent: 35 minutes  Author: Marrion Coy, MD 06/16/2022 11:58 AM  For on call review [email or web address].

## 2022-06-16 NOTE — Plan of Care (Signed)
  Problem: Education: Goal: Ability to describe self-care measures that may prevent or decrease complications (Diabetes Survival Skills Education) will improve Outcome: Progressing Goal: Individualized Educational Video(s) Outcome: Progressing   Problem: Coping: Goal: Ability to adjust to condition or change in health will improve Outcome: Progressing   Problem: Health Behavior/Discharge Planning: Goal: Ability to identify and utilize available resources and services will improve Outcome: Progressing   Problem: Metabolic: Goal: Ability to maintain appropriate glucose levels will improve Outcome: Progressing   Problem: Skin Integrity: Goal: Risk for impaired skin integrity will decrease Outcome: Progressing   Problem: Tissue Perfusion: Goal: Adequacy of tissue perfusion will improve Outcome: Progressing   Problem: Education: Goal: Knowledge of General Education information will improve Description: Including pain rating scale, medication(s)/side effects and non-pharmacologic comfort measures Outcome: Progressing   Problem: Clinical Measurements: Goal: Will remain free from infection Outcome: Progressing Goal: Diagnostic test results will improve Outcome: Progressing Goal: Respiratory complications will improve Outcome: Progressing Goal: Cardiovascular complication will be avoided Outcome: Progressing   Problem: Nutrition: Goal: Adequate nutrition will be maintained Outcome: Progressing   Problem: Coping: Goal: Level of anxiety will decrease Outcome: Progressing   Problem: Elimination: Goal: Will not experience complications related to bowel motility Outcome: Progressing Goal: Will not experience complications related to urinary retention Outcome: Progressing   Problem: Pain Managment: Goal: General experience of comfort will improve Outcome: Progressing   Problem: Safety: Goal: Ability to remain free from injury will improve Outcome: Progressing    Problem: Skin Integrity: Goal: Risk for impaired skin integrity will decrease Outcome: Progressing

## 2022-06-17 DIAGNOSIS — G9341 Metabolic encephalopathy: Secondary | ICD-10-CM | POA: Diagnosis not present

## 2022-06-17 DIAGNOSIS — K56609 Unspecified intestinal obstruction, unspecified as to partial versus complete obstruction: Secondary | ICD-10-CM | POA: Diagnosis not present

## 2022-06-17 DIAGNOSIS — N3000 Acute cystitis without hematuria: Secondary | ICD-10-CM | POA: Diagnosis not present

## 2022-06-17 LAB — GLUCOSE, CAPILLARY
Glucose-Capillary: 120 mg/dL — ABNORMAL HIGH (ref 70–99)
Glucose-Capillary: 122 mg/dL — ABNORMAL HIGH (ref 70–99)
Glucose-Capillary: 126 mg/dL — ABNORMAL HIGH (ref 70–99)
Glucose-Capillary: 126 mg/dL — ABNORMAL HIGH (ref 70–99)

## 2022-06-17 LAB — BASIC METABOLIC PANEL
Anion gap: 9 (ref 5–15)
BUN: 17 mg/dL (ref 8–23)
CO2: 23 mmol/L (ref 22–32)
Calcium: 9.3 mg/dL (ref 8.9–10.3)
Chloride: 108 mmol/L (ref 98–111)
Creatinine, Ser: 0.99 mg/dL (ref 0.61–1.24)
GFR, Estimated: >60 mL/min (ref >60)
Glucose, Bld: 126 mg/dL — ABNORMAL HIGH (ref 70–99)
Potassium: 4.3 mmol/L (ref 3.5–5.1)
Sodium: 140 mmol/L (ref 135–145)

## 2022-06-17 NOTE — Plan of Care (Signed)
  Problem: Education: Goal: Ability to describe self-care measures that may prevent or decrease complications (Diabetes Survival Skills Education) will improve Outcome: Progressing Goal: Individualized Educational Video(s) Outcome: Progressing   Problem: Coping: Goal: Ability to adjust to condition or change in health will improve Outcome: Progressing   Problem: Health Behavior/Discharge Planning: Goal: Ability to identify and utilize available resources and services will improve Outcome: Progressing   Problem: Metabolic: Goal: Ability to maintain appropriate glucose levels will improve Outcome: Progressing   Problem: Skin Integrity: Goal: Risk for impaired skin integrity will decrease Outcome: Progressing   Problem: Tissue Perfusion: Goal: Adequacy of tissue perfusion will improve Outcome: Progressing   Problem: Education: Goal: Knowledge of General Education information will improve Description: Including pain rating scale, medication(s)/side effects and non-pharmacologic comfort measures Outcome: Progressing   Problem: Clinical Measurements: Goal: Will remain free from infection Outcome: Progressing Goal: Diagnostic test results will improve Outcome: Progressing Goal: Respiratory complications will improve Outcome: Progressing Goal: Cardiovascular complication will be avoided Outcome: Progressing   Problem: Nutrition: Goal: Adequate nutrition will be maintained Outcome: Progressing   Problem: Coping: Goal: Level of anxiety will decrease Outcome: Progressing   Problem: Elimination: Goal: Will not experience complications related to bowel motility Outcome: Progressing Goal: Will not experience complications related to urinary retention Outcome: Progressing   Problem: Pain Managment: Goal: General experience of comfort will improve Outcome: Progressing   Problem: Safety: Goal: Ability to remain free from injury will improve Outcome: Progressing    Problem: Skin Integrity: Goal: Risk for impaired skin integrity will decrease Outcome: Progressing   

## 2022-06-17 NOTE — [place] FL2 (Signed)
[place] MEDICAID [place]2 LEVEL OF CARE FORM     IDENTIFICATION  Patient Name: Matthew Goodwin Birthdate: [birth year] Sex: male Admission Date (Current Location): 06/11/2022  [facility] and [place] Number:  Chiropodist and Address:  [facility], [address], [place], [place] [address]      Provider Number: [id number]  Attending Physician Name and Address:  Marrion Coy, MD  Relative Name and Phone Number:  [place], Bogd[place]ki [phone]    Current Level of Care: Hospital Recommended Level of Care: Skilled Nursing Facility Prior Approval Number:    Date Approved/Denied:   PASRR Number:    Discharge Plan: Domiciliary (Rest home)    Current Diagnoses: Patient Active Problem List   Diagnosis Date Noted   Hypernatremia 06/16/2022   Acute metabolic encephalopathy 06/15/2022   Protein-calorie malnutrition, severe (HCC) 06/13/2022   AKI (acute kidney injury) (HCC) 06/12/2022   Sinus bradycardia 06/12/2022   Electrolyte abnormality 06/12/2022   Nausea and vomiting 06/12/2022   SBO (small bowel obstruction) (HCC) 06/12/2022   Generalized weakness 12/13/2021   UTI (urinary tract infection) 12/13/2021   Chronic anemia 10/16/2021   Dementia with behavioral disturbance (HCC) 10/16/2021   GERD (gastroesophageal reflux disease) 10/12/2021   BPH (benign prostatic hyperplasia) 10/12/2021   Closed compression fracture of L2 vertebra (HCC) 05/07/2019   Back pain 05/06/2019   Closed compression fracture of body of L1 vertebra (HCC) 05/06/2019   Acute lower UTI 05/06/2019   DM type 2 (diabetes mellitus, type 2) (HCC) 05/06/2019   Aortic stenosis 05/06/2019   Benign essential HTN 05/06/2019    Orientation RESPIRATION BLADDER Height & Weight     Self  Normal Incontinent, External catheter Weight: 129 lb 6.6 oz (58.7 kg) Height:  6\' 2"  (188 cm)  BEHAVIORAL SYMPTOMS/MOOD NEUROLOGICAL BOWEL NUTRITION STATUS      Incontinent Diet see dc summary   AMBULATORY STATUS COMMUNICATION OF NEEDS Skin       Normal                       Personal Care Assistance Level of Assistance              Functional Limitations Info  Sight, Hearing, Speech Sight Info: Adequate Hearing Info: Adequate Speech Info: Adequate    SPECIAL CARE FACTORS FREQUENCY                       Contractures Contractures Info: Not present    Additional Factors Info  Code Status Code Status Info: DNR             Current Medications (06/17/2022):  This is the current hospital active medication list Current Facility-Administered Medications  Medication Dose Route Frequency Provider Last Rate Last Admin   acetaminophen (TYLENOL) tablet 650 mg  650 mg Per Tube Q6H PRN Gertha Calkin, MD       Or   acetaminophen (TYLENOL) suppository 650 mg  650 mg Rectal Q6H PRN Gertha Calkin, MD       feeding supplement (ENSURE ENLIVE / ENSURE PLUS) liquid 237 mL  237 mL Oral TID BM Marrion Coy, MD   237 mL at 06/17/22 0837   fluorometholone (FML) 0.1 % ophthalmic suspension 1 drop  1 drop Both Eyes Daily PRN Gertha Calkin, MD       haloperidol lactate (HALDOL) injection 2 mg  2 mg Intravenous Q6H PRN Marrion Coy, MD   2 mg at 06/16/22 [phone]  heparin injection 5,000 Units  5,000 Units Subcutaneous Q8H Gertha Calkin, MD   5,000 Units at 06/17/22 0513   hydrALAZINE (APRESOLINE) injection 5 mg  5 mg Intravenous Q4H PRN Gertha Calkin, MD   5 mg at 06/16/22 1100   insulin aspart (novoLOG) injection 0-9 Units  0-9 Units Subcutaneous TID WC Gertha Calkin, MD   1 Units at 06/17/22 0835   losartan (COZAAR) tablet 50 mg  50 mg Oral Daily Marrion Coy, MD   50 mg at 06/17/22 6387   melatonin tablet 2.5 mg  2.5 mg Oral QHS Marrion Coy, MD   2.5 mg at 06/16/22 2238   metoprolol succinate (TOPROL-XL) 24 hr tablet 50 mg  50 mg Oral Daily Marrion Coy, MD   50 mg at 06/17/22 5643   multivitamin with minerals tablet 1 tablet  1 tablet Oral Daily Marrion Coy, MD   1 tablet at  06/17/22 3295   Oral care mouth rinse  15 mL Mouth Rinse PRN Delfino Lovett, MD       pantoprazole (PROTONIX) EC tablet 40 mg  40 mg Oral Daily Marrion Coy, MD   40 mg at 06/16/22 1057   sodium chloride 0.9 % bolus 500 mL  500 mL Intravenous Once Gertha Calkin, MD   Stopped at 06/11/22 2141   sodium chloride flush (NS) 0.9 % injection 3 mL  3 mL Intravenous Q12H Gertha Calkin, MD   3 mL at 06/17/22 0837   tamsulosin (FLOMAX) capsule 0.4 mg  0.4 mg Oral Daily Marrion Coy, MD   0.4 mg at 06/17/22 1884   thiamine (VITAMIN B1) tablet 100 mg  100 mg Oral Daily Marrion Coy, MD   100 mg at 06/17/22 1660     Discharge Medications: Please see discharge summary for a list of discharge medications. \     TAKE these medications     acetaminophen 325 MG tablet Commonly known as: TYLENOL Take 650 mg by mouth every 6 (six) hours as needed for mild pain or moderate pain.    atorvastatin 10 MG tablet Commonly known as: LIPITOR Take 10 mg by mouth at bedtime.    bacitracin ointment Apply 1 Application topically 3 (three) times daily as needed for wound care (for infection/redness of skin around catheter/wound).    CORICIDIN HBP COUGH/COLD PO Take 1 tablet by mouth every 6 (six) hours as needed.    docusate sodium 100 MG capsule Commonly known as: COLACE Take 100 mg by mouth daily.    famotidine 20 MG tablet Commonly known as: PEPCID Take 20 mg by mouth at bedtime.    ferrous sulfate 325 (65 FE) MG tablet Take 325 mg by mouth daily.    Fish Oil 1000 MG Caps Take 1,000 mg by mouth 2 (two) times daily.    fluorometholone 0.1 % ophthalmic suspension Commonly known as: FML Place 1 drop into both eyes daily.    levocetirizine 5 MG tablet Commonly known as: XYZAL Take 5 mg by mouth daily.    losartan 50 MG tablet Commonly known as: COZAAR Take 50 mg by mouth daily.    melatonin 5 MG Tabs Take 5 mg by mouth at bedtime.    metoprolol succinate 50 MG 24 hr tablet Commonly known as:  TOPROL-XL Take 50 mg by mouth at bedtime. Hold for pulse <50    tamsulosin 0.4 MG Caps capsule Commonly known as: FLOMAX Take 0.4 mg by mouth daily.    Vitamin D (Ergocalciferol) 1.25 MG (  50000 UNIT) Caps capsule Commonly known as: DRISDOL Take 50,000 Units by mouth every Wednesday.     Relevant Imaging Results:  Relevant Lab Results:   Additional Information : [id number]  Allena Katz, LCSW
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
## 2022-06-17 NOTE — Progress Notes (Signed)
Report called to [facility].

## 2022-06-17 NOTE — TOC Transition Note (Addendum)
Transition of Care [facility]) - CM/SW Discharge Note   Patient Details  Name: Matthew Goodwin MRN: [id number] Date of Birth: [birth year]  Transition [facility] CM/SW Contact:  Allena Katz, LCSW Phone Number: 06/17/2022, 9:30 AM   Clinical Narrative:    Pt to discharge back to [facility]. CSW spoke with [facility] who report they are able to take pt back today. Facility states that we can send fl2 and summary along with patient when we call ems. DNR is signed and on the chart. RN given number for report. Medical necessity printed to unit. CSW LVM for patients son gary to inform of discharge.     Final next level of care: Skilled Nursing Facility Barriers to Discharge: Barriers Resolved   Patient Goals and CMS Choice CMS Medicare.gov Compare Post Acute Care list provided to:: Patient Choice offered to / list presented to : Patient  Discharge Placement                  Patient to be transferred to facility by: [facility]   Patient and family notified of of transfer: 06/17/22  Discharge Plan and Services Additional resources added to the After Visit Summary for                                    Representative spoke with at [facility] Agency: N/A  Social Determinants of Health (SDOH) Interventions SDOH Screenings   Food Insecurity: No Food Insecurity (10/13/2021)  Housing: Low Risk  (10/13/2021)  Transportation Needs: No Transportation Needs (10/13/2021)  Utilities: Not At Risk (10/13/2021)  Tobacco Use: Low Risk  (06/12/2022)     Readmission Risk Interventions     No data to display

## 2022-07-03 DEATH — deceased
